# Patient Record
Sex: Male | Born: 1970 | Race: White | Hispanic: No | Marital: Married | Smoking: Never smoker
Health system: Southern US, Community
[De-identification: ages and names within clinical notes are randomized; demographics above are authoritative.]

## PROBLEM LIST (undated history)

## (undated) DIAGNOSIS — R002 Palpitations: Secondary | ICD-10-CM

## (undated) DIAGNOSIS — K8020 Calculus of gallbladder without cholecystitis without obstruction: Secondary | ICD-10-CM

## (undated) DIAGNOSIS — R0789 Other chest pain: Secondary | ICD-10-CM

## (undated) DIAGNOSIS — Z8679 Personal history of other diseases of the circulatory system: Secondary | ICD-10-CM

## (undated) HISTORY — PX: CARDIAC CATHETERIZATION: SHX172

## (undated) HISTORY — PX: CHOLECYSTECTOMY: SHX55

---

## 2007-04-16 ENCOUNTER — Ambulatory Visit: Payer: Self-pay | Admitting: Emergency Medicine

## 2008-08-01 ENCOUNTER — Observation Stay: Payer: Self-pay | Admitting: Cardiovascular Disease

## 2008-08-05 ENCOUNTER — Emergency Department: Payer: Self-pay | Admitting: Emergency Medicine

## 2008-08-06 ENCOUNTER — Ambulatory Visit: Payer: Self-pay | Admitting: General Surgery

## 2008-08-09 ENCOUNTER — Ambulatory Visit: Payer: Self-pay | Admitting: General Surgery

## 2008-08-12 ENCOUNTER — Ambulatory Visit: Payer: Self-pay | Admitting: General Surgery

## 2008-08-19 ENCOUNTER — Ambulatory Visit: Payer: Self-pay | Admitting: General Surgery

## 2011-03-12 IMAGING — CR DG CHEST 1V PORT
1 series · 1 of 1 positions shown · non-contrast
Comparison: none

REASON FOR EXAM: cp
COMMENTS:

PROCEDURE:     DXR - DXR PORTABLE CHEST SINGLE VIEW  - August 01, 2008  [DATE]
RESULT:     The lungs are clear. The cardiac silhouette and visualized bony
skeleton are unremarkable.

[view not recorded]
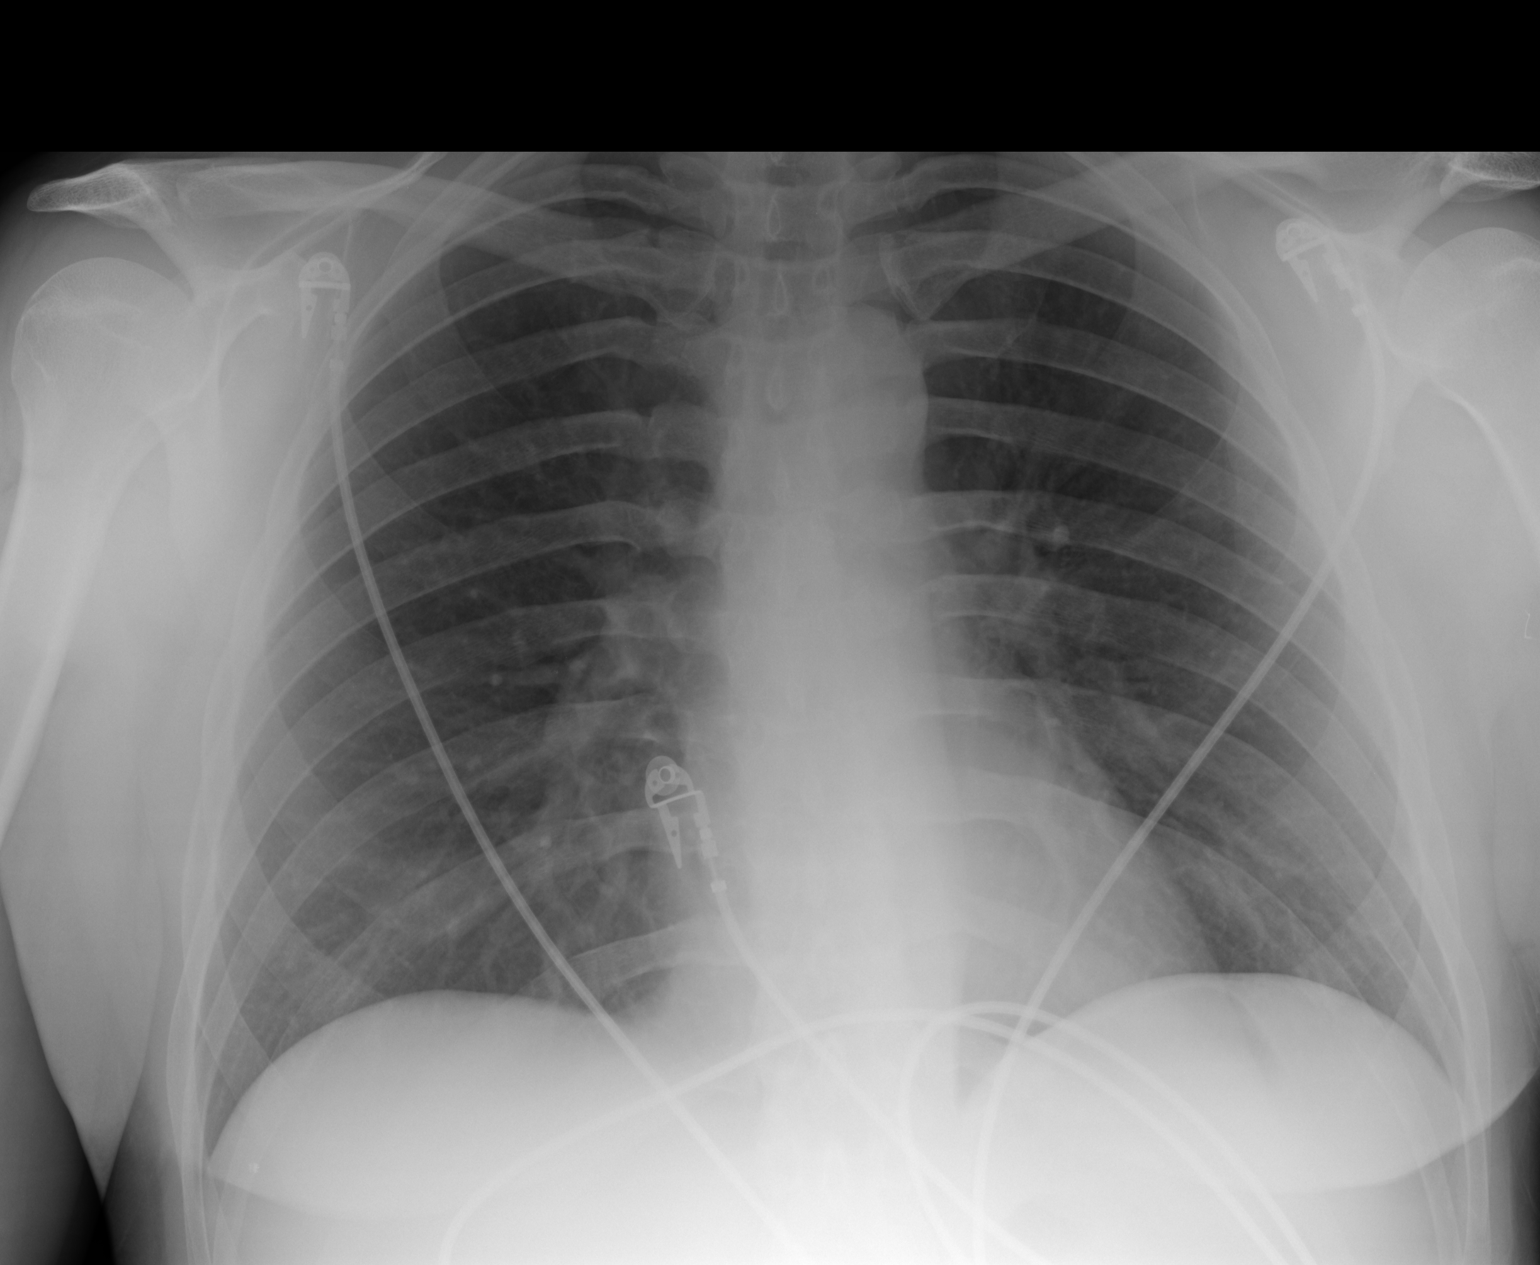

[1 of 1 positions shown; findings below may reference images not displayed]

IMPRESSION: 1. Chest radiograph without evidence of acute cardiopulmonary disease.

## 2015-09-11 ENCOUNTER — Encounter: Payer: Self-pay | Admitting: Gynecology

## 2015-09-11 ENCOUNTER — Ambulatory Visit
Admission: EM | Admit: 2015-09-11 | Discharge: 2015-09-11 | Disposition: A | Discharge status: Home / Self Care | Payer: BLUE CROSS/BLUE SHIELD | Attending: Family Medicine | Admitting: Family Medicine

## 2015-09-11 DIAGNOSIS — J101 Influenza due to other identified influenza virus with other respiratory manifestations: Secondary | ICD-10-CM | POA: Diagnosis not present

## 2015-09-11 HISTORY — DX: Other chest pain: R07.89

## 2015-09-11 HISTORY — DX: Calculus of gallbladder without cholecystitis without obstruction: K80.20

## 2015-09-11 HISTORY — DX: Palpitations: R00.2

## 2015-09-11 LAB — RAPID INFLUENZA A&B ANTIGENS (ARMC ONLY): INFLUENZA A (ARMC): NEGATIVE

## 2015-09-11 LAB — RAPID INFLUENZA A&B ANTIGENS: Influenza B (ARMC): POSITIVE — AB

## 2015-09-11 LAB — RAPID STREP SCREEN (MED CTR MEBANE ONLY): Streptococcus, Group A Screen (Direct): NEGATIVE

## 2015-09-11 MED ORDER — OSELTAMIVIR PHOSPHATE 75 MG PO CAPS: 75.0000 mg | ORAL_CAPSULE | Freq: Two times a day (BID) | ORAL | Status: DC

## 2015-09-11 MED ORDER — BENZONATATE 100 MG PO CAPS: 100.0000 mg | ORAL_CAPSULE | Freq: Three times a day (TID) | ORAL | Status: DC

## 2015-09-11 MED ORDER — FEXOFENADINE-PSEUDOEPHED ER 180-240 MG PO TB24: 1.0000 | ORAL_TABLET | Freq: Every day | ORAL | Status: DC

## 2015-09-11 MED ORDER — MELOXICAM 15 MG PO TABS: 15.0000 mg | ORAL_TABLET | Freq: Every day | ORAL | Status: DC

## 2015-09-11 NOTE — ED Provider Notes (Signed)
CSN: [id number]     Arrival date & time 09/11/15  1153 History   First MD Initiated Contact with Patient 09/11/15 1327     Nurses notes were reviewed. Chief Complaint  Patient presents with  . Generalized Body Aches  . Sore Throat   Patient reports having a sore throat and flulike symptoms started essentially eat Friday. 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 After he got back from his trip was straight to bed late down morning but worse so he came in to be seen. He has not had nor does he usually get flu shots. No nose wound has been sick lately. He's had cholecystectomy cardiac catheterization but never smoked a taking any medication. He has a brother who has hyperlipidemia and mother had lung cancer.  He denies low-grade fever not coughing all the time coughing occasionally and feels achy all over and has myalgia.  (Consider location/radiation/quality/duration/timing/severity/associated sxs/prior Treatment) Patient is a 45 y.o. male presenting with pharyngitis. The history is provided by the patient. No language interpreter was used.  Sore Throat This is a new problem. The current episode started 2 days ago. The problem occurs constantly. The problem has been gradually worsening. Pertinent negatives include no chest pain, no abdominal pain, no headaches and no shortness of breath. Nothing aggravates the symptoms. Nothing relieves the symptoms. He has tried nothing for the symptoms. The treatment provided no relief.    Past Medical History  Diagnosis Date  . Palpitations   . Chest pain, atypical   . Gall bladder stones    Past Surgical History  Procedure Laterality Date  . Cholecystectomy    . Cardiac catheterization     No family history on file. Social History  Substance Use Topics  . Smoking status: Never Smoker   . Smokeless tobacco: None  . Alcohol Use: No    Review of Systems   HENT: Positive for congestion, rhinorrhea, sinus pressure and sore throat.   Respiratory: Positive for cough. Negative for shortness of breath.   Cardiovascular: Negative for chest pain.  Gastrointestinal: Negative for abdominal pain.  Musculoskeletal: Positive for myalgias.  Neurological: Negative for headaches.    Allergies  Review of patient's allergies indicates no known allergies.  Home Medications   Prior to Admission medications   Medication Sig Start Date End Date Taking? Authorizing Provider  benzonatate (TESSALON) 100 MG capsule Take 1 capsule (100 mg total) by mouth every 8 (eight) hours. 09/11/15   Hassan RowanEugene Alijah Hyde, MD  fexofenadine-pseudoephedrine (ALLEGRA-D ALLERGY & CONGESTION) 180-240 MG 24 hr tablet Take 1 tablet by mouth daily. 09/11/15   Hassan RowanEugene Audric Venn, MD  meloxicam (MOBIC) 15 MG tablet Take 1 tablet (15 mg total) by mouth daily. 09/11/15   Hassan RowanEugene Akram Kissick, MD  multivitamin-iron-minerals-folic acid [facility](THERAPEUTIC-M) TABS tablet Take 1 tablet by mouth daily.    Historical Provider, MD  oseltamivir (TAMIFLU) 75 MG capsule Take 1 capsule (75 mg total) by mouth 2 (two) times daily. 09/11/15   Hassan RowanEugene Hermilo Dutter, MD   Meds Ordered and Administered this Visit  Medications - No data to display  BP 118/68 mmHg  Pulse 82  Temp(Src) 99.5 F (37.5 C) (Oral)  Resp 16  Ht 5\' 10"  (1.778 m)  Wt 180 lb (81.647 kg)  BMI 25.83 kg/m2  SpO2 100% No data found.   Physical Exam  Constitutional: He appears well-developed and well-nourished.  HENT:  Head:  Normocephalic and atraumatic.  Right Ear: Hearing, tympanic membrane, external ear and ear canal normal.  Left Ear: Hearing, tympanic membrane, external ear and ear canal normal.  Nose: Mucosal edema and rhinorrhea present. Right sinus exhibits no maxillary sinus tenderness and no frontal sinus tenderness. Left sinus exhibits no maxillary sinus tenderness and no frontal sinus tenderness.  Mouth/Throat: Uvula is midline. Posterior oropharyngeal  erythema present.  Eyes: Pupils are equal, round, and reactive to light.  Vitals reviewed.   ED Course  Procedures (including critical care time)  Labs Review Labs Reviewed  RAPID INFLUENZA A&B ANTIGENS ([facility] ONLY) - Abnormal; Notable for the following:    Influenza B ([facility]) POSITIVE (*)    All other components within normal limits  RAPID STREP SCREEN (NOT AT [facility])  CULTURE, GROUP A STREP [facility])    Imaging Review No results found.   Visual Acuity Review  Right Eye Distance:   Left Eye Distance:   Bilateral Distance:    Right Eye Near:   Left Eye Near:    Bilateral Near:     Results for orders placed or performed during the hospital encounter of 09/11/15  Rapid Influenza A&B Antigens ([facility] only)  Result Value Ref Range   Influenza A ([facility]) NEGATIVE NEGATIVE   Influenza B ([facility]) POSITIVE (A) NEGATIVE  Rapid strep screen  Result Value Ref Range   Streptococcus, Group A Screen (Direct) NEGATIVE NEGATIVE     MDM   1. Type B influenza    Patient with positive B influenza will place him on Tessalon Perles 200 mg 1 by mouth every 8 hour when necessary for cough Tamiflu 75 mg twice a day for 5 days Allegra-D daily basis and Mobic 15 mg aches and pains. Follow-up with PCP if not better in 3 days will give a work note for Monday and Tuesday and no popping for the problems weight of dictation.     Note: This dictation was prepared with Dragon dictation along with smaller phrase technology. Any transcriptional errors that result from this process are unintentional.     Hassan Rowan, MD 09/11/15 [phone]

## 2015-09-11 NOTE — ED Notes (Signed)
Per patient x 2 days body aches , sore throat , chills , fatigue/ congestion / fever at home of 100.

## 2015-09-11 NOTE — Discharge Instructions (Signed)
Influenza, Adult °Influenza (flu) is an infection in the mouth, nose, and throat (respiratory tract) caused by a virus. The flu can make you feel very ill. Influenza spreads easily from person to person (contagious).  °HOME CARE  °· Only take medicines as told by your doctor. °· Use a cool mist humidifier to make breathing easier. °· Get plenty of rest until your fever goes away. This usually takes 3 to 4 days. °· Drink enough fluids to keep your pee (urine) clear or pale yellow. °· Cover your mouth and nose when you cough or sneeze. °· Wash your hands well to avoid spreading the flu. °· Stay home from work or school until your fever has been gone for at least 1 full day. °· Get a flu shot every year. °GET HELP RIGHT AWAY IF:  °· You have trouble breathing or feel short of breath. °· Your skin or nails turn blue. °· You have severe neck pain or stiffness. °· You have a severe headache, facial pain, or earache. °· Your fever gets worse or keeps coming back. °· You feel sick to your stomach (nauseous), throw up (vomit), or have watery poop (diarrhea). °· You have chest pain. °· You have a deep cough that gets worse, or you cough up more thick spit (mucus). °MAKE SURE YOU:  °· Understand these instructions. °· Will watch your condition. °· Will get help right away if you are not doing well or get worse. °  °This information is not intended to replace advice given to you by your health care provider. Make sure you discuss any questions you have with your health care provider. °  °Document Released: 02/14/2008 Document Revised: 05/28/2014 Document Reviewed: 08/06/2011 °Elsevier Interactive Patient Education ©2016 [employer] ° °Upper Respiratory Infection, Adult °Most upper respiratory infections (URIs) are caused by a virus. A URI affects the nose, throat, and upper air passages. The most common type of URI is often called "the common cold." °HOME CARE  °· Take medicines only as told by your doctor. °· Gargle warm  saltwater or take cough drops to comfort your throat as told by your doctor. °· Use a warm mist humidifier or inhale steam from a shower to increase air moisture. This may make it easier to breathe. °· Drink enough fluid to keep your pee (urine) clear or pale yellow. °· Eat soups and other clear broths. °· Have a healthy diet. °· Rest as needed. °· Go back to work when your fever is gone or your doctor says it is okay. °¨ You may need to stay home longer to avoid giving your URI to others. °¨ You can also wear a face mask and wash your hands often to prevent spread of the virus. °· Use your inhaler more if you have asthma. °· Do not use any tobacco products, including cigarettes, chewing tobacco, or electronic cigarettes. If you need help quitting, ask your doctor. °GET HELP IF: °· You are getting worse, not better. °· Your symptoms are not helped by medicine. °· You have chills. °· You are getting more short of breath. °· You have brown or red mucus. °· You have yellow or brown discharge from your nose. °· You have pain in your face, especially when you bend forward. °· You have a fever. °· You have puffy (swollen) neck glands. °· You have pain while swallowing. °· You have white areas in the back of your throat. °GET HELP RIGHT AWAY IF:  °· You have very bad   or constant: °¨ Headache. °¨ Ear pain. °¨ Pain in your forehead, behind your eyes, and over your cheekbones (sinus pain). °¨ Chest pain. °· You have long-lasting (chronic) lung disease and any of the following: °¨ Wheezing. °¨ Long-lasting cough. °¨ Coughing up blood. °¨ A change in your usual mucus. °· You have a stiff neck. °· You have changes in your: °¨ Vision. °¨ Hearing. °¨ Thinking. °¨ Mood. °MAKE SURE YOU:  °· Understand these instructions. °· Will watch your condition. °· Will get help right away if you are not doing well or get worse. °  °This information is not intended to replace advice given to you by your health care provider. Make sure you  discuss any questions you have with your health care provider. °  °Document Released: 10/24/2007 Document Revised: 09/21/2014 Document Reviewed: 08/12/2013 °Elsevier Interactive Patient Education ©2016 Elsevier Inc. ° °

## 2015-09-13 LAB — CULTURE, GROUP A STREP (THRC)

## 2018-04-16 ENCOUNTER — Encounter: Payer: Self-pay | Admitting: Emergency Medicine

## 2018-04-16 ENCOUNTER — Observation Stay
Admission: EM | Admit: 2018-04-16 | Discharge: 2018-04-16 | Disposition: A | Discharge status: Home / Self Care | Payer: BLUE CROSS/BLUE SHIELD | Attending: Family Medicine | Admitting: Family Medicine

## 2018-04-16 ENCOUNTER — Emergency Department: Payer: BLUE CROSS/BLUE SHIELD

## 2018-04-16 ENCOUNTER — Other Ambulatory Visit
Admission: RE | Admit: 2018-04-16 | Discharge: 2018-04-16 | Disposition: A | Discharge status: Home / Self Care | Payer: BLUE CROSS/BLUE SHIELD | Source: Ambulatory Visit | Attending: Family Medicine | Admitting: Family Medicine

## 2018-04-16 ENCOUNTER — Encounter
Admission: EM | Disposition: A | Discharge status: Home / Self Care | Payer: Self-pay | Source: Home / Self Care | Attending: Emergency Medicine

## 2018-04-16 DIAGNOSIS — R7989 Other specified abnormal findings of blood chemistry: Secondary | ICD-10-CM | POA: Diagnosis not present

## 2018-04-16 DIAGNOSIS — M546 Pain in thoracic spine: Secondary | ICD-10-CM | POA: Insufficient documentation

## 2018-04-16 DIAGNOSIS — R002 Palpitations: Secondary | ICD-10-CM

## 2018-04-16 DIAGNOSIS — G8929 Other chronic pain: Secondary | ICD-10-CM | POA: Diagnosis not present

## 2018-04-16 DIAGNOSIS — R0609 Other forms of dyspnea: Secondary | ICD-10-CM | POA: Insufficient documentation

## 2018-04-16 DIAGNOSIS — R030 Elevated blood-pressure reading, without diagnosis of hypertension: Secondary | ICD-10-CM | POA: Insufficient documentation

## 2018-04-16 DIAGNOSIS — I214 Non-ST elevation (NSTEMI) myocardial infarction: Secondary | ICD-10-CM

## 2018-04-16 DIAGNOSIS — R0789 Other chest pain: Secondary | ICD-10-CM | POA: Diagnosis not present

## 2018-04-16 DIAGNOSIS — M549 Dorsalgia, unspecified: Secondary | ICD-10-CM | POA: Insufficient documentation

## 2018-04-16 DIAGNOSIS — I471 Supraventricular tachycardia: Principal | ICD-10-CM | POA: Insufficient documentation

## 2018-04-16 DIAGNOSIS — R9431 Abnormal electrocardiogram [ECG] [EKG]: Secondary | ICD-10-CM | POA: Diagnosis not present

## 2018-04-16 DIAGNOSIS — I4510 Unspecified right bundle-branch block: Secondary | ICD-10-CM | POA: Insufficient documentation

## 2018-04-16 DIAGNOSIS — R079 Chest pain, unspecified: Secondary | ICD-10-CM

## 2018-04-16 HISTORY — PX: LEFT HEART CATH: CATH118248

## 2018-04-16 LAB — COMPREHENSIVE METABOLIC PANEL
ALBUMIN: 4.4 g/dL (ref 3.5–5.0)
ALK PHOS: 55 U/L (ref 38–126)
ALT: 24 U/L (ref 0–44)
AST: 27 U/L (ref 15–41)
Anion gap: 10 (ref 5–15)
BUN: 13 mg/dL (ref 6–20)
CALCIUM: 9.2 mg/dL (ref 8.9–10.3)
CO2: 29 mmol/L (ref 22–32)
CREATININE: 1.05 mg/dL (ref 0.61–1.24)
Chloride: 99 mmol/L (ref 98–111)
GFR calc Af Amer: >60 mL/min (ref >60)
GFR calc non Af Amer: >60 mL/min (ref >60)
GLUCOSE: 154 mg/dL — AB (ref 70–99)
Potassium: 3.9 mmol/L (ref 3.5–5.1)
SODIUM: 138 mmol/L (ref 135–145)
Total Bilirubin: 1.4 mg/dL — ABNORMAL HIGH (ref 0.3–1.2)
Total Protein: 7.2 g/dL (ref 6.5–8.1)

## 2018-04-16 LAB — CBC WITH DIFFERENTIAL/PLATELET
Abs Immature Granulocytes: 0.05 10*3/uL (ref 0.00–0.07)
BASOS ABS: 0 10*3/uL (ref 0.0–0.1)
BASOS PCT: 0 %
EOS ABS: 0.1 10*3/uL (ref 0.0–0.5)
EOS PCT: 1 %
HCT: 44 % (ref 39.0–52.0)
HEMOGLOBIN: 14.6 g/dL (ref 13.0–17.0)
IMMATURE GRANULOCYTES: 1 %
LYMPHS ABS: 0.9 10*3/uL (ref 0.7–4.0)
Lymphocytes Relative: 8 %
MCH: 29 pg (ref 26.0–34.0)
MCHC: 33.2 g/dL (ref 30.0–36.0)
MCV: 87.3 fL (ref 80.0–100.0)
Monocytes Absolute: 0.8 10*3/uL (ref 0.1–1.0)
Monocytes Relative: 8 %
NEUTROS PCT: 82 %
NRBC: 0 % (ref 0.0–0.2)
Neutro Abs: 8.6 10*3/uL — ABNORMAL HIGH (ref 1.7–7.7)
Platelets: 217 10*3/uL (ref 150–400)
RBC: 5.04 MIL/uL (ref 4.22–5.81)
RDW: 11.8 % (ref 11.5–15.5)
WBC: 10.4 10*3/uL (ref 4.0–10.5)

## 2018-04-16 LAB — CKMB (ARMC ONLY): CK, MB: 2.8 ng/mL (ref 0.5–5.0)

## 2018-04-16 LAB — PROTIME-INR
INR: 1.02
PROTHROMBIN TIME: 13.3 s (ref 11.4–15.2)

## 2018-04-16 LAB — TROPONIN I
TROPONIN I: 0.05 ng/mL — AB (ref <0.03)
Troponin I: <0.03 ng/mL (ref <0.03)

## 2018-04-16 LAB — FIBRIN DERIVATIVES D-DIMER (ARMC ONLY): FIBRIN DERIVATIVES D-DIMER (ARMC): 228.57 ng{FEU}/mL (ref 0.00–499.00)

## 2018-04-16 LAB — APTT: aPTT: 32 seconds (ref 24–36)

## 2018-04-16 SURGERY — LEFT HEART CATH
Anesthesia: Moderate Sedation

## 2018-04-16 MED ORDER — HEPARIN BOLUS VIA INFUSION
4000.0000 [IU] | Freq: Once | INTRAVENOUS | Status: DC
  Filled 2018-04-16: qty 4000

## 2018-04-16 MED ORDER — HEPARIN SODIUM (PORCINE) 1000 UNIT/ML IJ SOLN
INTRAMUSCULAR | Status: DC | PRN
  Administered 2018-04-16: 4200 [IU] via INTRAVENOUS

## 2018-04-16 MED ORDER — ASPIRIN EC 325 MG PO TBEC: 325.0000 mg | DELAYED_RELEASE_TABLET | Freq: Every day | ORAL | Status: DC

## 2018-04-16 MED ORDER — METOPROLOL SUCCINATE ER 25 MG PO TB24: 25.0000 mg | ORAL_TABLET | Freq: Every day | ORAL | 0 refills | Status: AC

## 2018-04-16 MED ORDER — SODIUM CHLORIDE 0.9% FLUSH: 3.0000 mL | Freq: Two times a day (BID) | INTRAVENOUS | Status: DC

## 2018-04-16 MED ORDER — HYOSCYAMINE SULFATE 0.125 MG SL SUBL: 0.2500 mg | SUBLINGUAL_TABLET | Freq: Once | SUBLINGUAL | Status: DC

## 2018-04-16 MED ORDER — FENTANYL CITRATE (PF) 100 MCG/2ML IJ SOLN
INTRAMUSCULAR | Status: AC
  Filled 2018-04-16: qty 2

## 2018-04-16 MED ORDER — MORPHINE SULFATE (PF) 4 MG/ML IV SOLN: 2.0000 mg | INTRAVENOUS | Status: DC | PRN

## 2018-04-16 MED ORDER — SODIUM CHLORIDE 0.9% FLUSH: 3.0000 mL | INTRAVENOUS | Status: DC | PRN

## 2018-04-16 MED ORDER — ASPIRIN 325 MG PO TABS
325.0000 mg | ORAL_TABLET | Freq: Once | ORAL | Status: AC
  Administered 2018-04-16: 325 mg via ORAL
  Filled 2018-04-16: qty 1

## 2018-04-16 MED ORDER — ALPRAZOLAM 0.5 MG PO TABS: 0.2500 mg | ORAL_TABLET | Freq: Two times a day (BID) | ORAL | Status: DC | PRN

## 2018-04-16 MED ORDER — SODIUM CHLORIDE 0.9 % WEIGHT BASED INFUSION: 80.0000 mL/h | INTRAVENOUS | Status: DC

## 2018-04-16 MED ORDER — NITROGLYCERIN 0.4 MG SL SUBL: 0.4000 mg | SUBLINGUAL_TABLET | SUBLINGUAL | Status: DC | PRN

## 2018-04-16 MED ORDER — SODIUM CHLORIDE 0.9 % IV SOLN: 250.0000 mL | INTRAVENOUS | Status: DC | PRN

## 2018-04-16 MED ORDER — VERAPAMIL HCL 2.5 MG/ML IV SOLN
INTRAVENOUS | Status: AC
  Filled 2018-04-16: qty 2

## 2018-04-16 MED ORDER — SODIUM CHLORIDE 0.9 % WEIGHT BASED INFUSION: 250.0000 mL/h | INTRAVENOUS | Status: AC

## 2018-04-16 MED ORDER — ACETAMINOPHEN 325 MG PO TABS: 650.0000 mg | ORAL_TABLET | ORAL | Status: DC | PRN

## 2018-04-16 MED ORDER — HEPARIN SODIUM (PORCINE) 1000 UNIT/ML IJ SOLN
INTRAMUSCULAR | Status: AC
  Filled 2018-04-16: qty 1

## 2018-04-16 MED ORDER — HEPARIN (PORCINE) 25000 UT/250ML-% IV SOLN
1000.0000 [IU]/h | INTRAVENOUS | Status: DC
  Filled 2018-04-16: qty 250

## 2018-04-16 MED ORDER — SODIUM CHLORIDE 0.9 % WEIGHT BASED INFUSION: 1.0000 mL/kg/h | INTRAVENOUS | Status: DC

## 2018-04-16 MED ORDER — ENOXAPARIN SODIUM 40 MG/0.4ML ~~LOC~~ SOLN: 40.0000 mg | SUBCUTANEOUS | Status: DC

## 2018-04-16 MED ORDER — METOPROLOL TARTRATE 5 MG/5ML IV SOLN
INTRAVENOUS | Status: DC | PRN
  Administered 2018-04-16: 5 mg via INTRAVENOUS

## 2018-04-16 MED ORDER — ONDANSETRON HCL 4 MG/2ML IJ SOLN: 4.0000 mg | Freq: Four times a day (QID) | INTRAMUSCULAR | Status: DC | PRN

## 2018-04-16 MED ORDER — METOPROLOL SUCCINATE ER 50 MG PO TB24: 25.0000 mg | ORAL_TABLET | Freq: Every day | ORAL | Status: DC

## 2018-04-16 MED ORDER — FENTANYL CITRATE (PF) 100 MCG/2ML IJ SOLN
INTRAMUSCULAR | Status: DC | PRN
  Administered 2018-04-16: 50 ug via INTRAVENOUS

## 2018-04-16 MED ORDER — IOPAMIDOL (ISOVUE-300) INJECTION 61%
INTRAVENOUS | Status: DC | PRN
  Administered 2018-04-16: 90 mL via INTRA_ARTERIAL

## 2018-04-16 MED ORDER — DICYCLOMINE HCL 10 MG/5ML PO SOLN: 10.0000 mg | Freq: Once | ORAL | Status: DC

## 2018-04-16 MED ORDER — HEPARIN (PORCINE) IN NACL 1000-0.9 UT/500ML-% IV SOLN
INTRAVENOUS | Status: AC
  Filled 2018-04-16: qty 1000

## 2018-04-16 MED ORDER — LIDOCAINE VISCOUS HCL 2 % MT SOLN: 15.0000 mL | Freq: Once | OROMUCOSAL | Status: DC

## 2018-04-16 MED ORDER — METOPROLOL TARTRATE 5 MG/5ML IV SOLN
INTRAVENOUS | Status: AC
  Filled 2018-04-16: qty 5

## 2018-04-16 MED ORDER — VERAPAMIL HCL 2.5 MG/ML IV SOLN
INTRAVENOUS | Status: DC | PRN
  Administered 2018-04-16: 2.5 mg via INTRA_ARTERIAL

## 2018-04-16 MED ORDER — MIDAZOLAM HCL 2 MG/2ML IJ SOLN
INTRAMUSCULAR | Status: AC
  Filled 2018-04-16: qty 2

## 2018-04-16 MED ORDER — ALUM & MAG HYDROXIDE-SIMETH 200-200-20 MG/5ML PO SUSP: 30.0000 mL | Freq: Once | ORAL | Status: DC

## 2018-04-16 MED ORDER — MIDAZOLAM HCL 2 MG/2ML IJ SOLN
INTRAMUSCULAR | Status: DC | PRN
  Administered 2018-04-16: 1 mg via INTRAVENOUS

## 2018-04-16 MED ORDER — ASPIRIN 81 MG PO CHEW: 81.0000 mg | CHEWABLE_TABLET | ORAL | Status: DC

## 2018-04-16 SURGICAL SUPPLY — 10 items
CATH INFINITI 5FR ANG PIGTAIL (CATHETERS) ×3 IMPLANT
CATH INFINITI 5FR TG (CATHETERS) ×3 IMPLANT
DEVICE RAD TR BAND REGULAR (VASCULAR PRODUCTS) ×3 IMPLANT
GLIDESHEATH SLEND SS 6F .021 (SHEATH) ×3 IMPLANT
KIT MANI 3VAL PERCEP (MISCELLANEOUS) ×3 IMPLANT
NEEDLE PERC 18GX7CM (NEEDLE) IMPLANT
PACK CARDIAC CATH (CUSTOM PROCEDURE TRAY) ×3 IMPLANT
SHEATH AVANTI 6FR X 11CM (SHEATH) IMPLANT
WIRE GUIDERIGHT .035X150 (WIRE) IMPLANT
WIRE ROSEN-J .035X260CM (WIRE) ×3 IMPLANT

## 2018-04-16 NOTE — H&P (Signed)
[employer] - [place] [facility]   PATIENT NAME: Steven Rowe    MR#:  [id number]  DATE OF BIRTH:  [birth year]  DATE OF ADMISSION:  04/16/2018  PRIMARY CARE PHYSICIAN: No primary care provider on file.   REQUESTING/REFERRING PHYSICIAN:   CHIEF COMPLAINT:   Chief Complaint  Patient presents with  . Abnormal Lab    HISTORY OF PRESENT ILLNESS: Steven Rowe  is a 47 y.o. male with a known history per below, presented to the emergency room with acute on chronic chest pain, code STEMI called in the emergency room, patient evaluated by Dr. Doran ClayParaschos-patient taken to heart catheterization for further evaluation/care, hospitalist asked to admit, patient in no apparent distress, resting comfortably in bed, patient now be admitted for acute on chronic chest pain.  PAST MEDICAL HISTORY:   Past Medical History:  Diagnosis Date  . Chest pain, atypical   . Gall bladder stones   . Palpitations     PAST SURGICAL HISTORY:  Past Surgical History:  Procedure Laterality Date  . CARDIAC CATHETERIZATION    . CHOLECYSTECTOMY      SOCIAL HISTORY:  Social History   Tobacco Use  . Smoking status: Never Smoker  . Smokeless tobacco: Never Used  Substance Use Topics  . Alcohol use: No    FAMILY HISTORY: No family history on file.  DRUG ALLERGIES: No Known Allergies  REVIEW OF SYSTEMS:   CONSTITUTIONAL: No fever, fatigue or weakness.  EYES: No blurred or double vision.  EARS, NOSE, AND THROAT: No tinnitus or ear pain.  RESPIRATORY: No cough, shortness of breath, wheezing or hemoptysis.  CARDIOVASCULAR: No chest pain, orthopnea, edema.  GASTROINTESTINAL: No nausea, vomiting, diarrhea or abdominal pain.  GENITOURINARY: No dysuria, hematuria.  ENDOCRINE: No polyuria, nocturia,  HEMATOLOGY: No anemia, easy bruising or bleeding SKIN: No rash or lesion. MUSCULOSKELETAL: No joint pain or arthritis.   NEUROLOGIC: No tingling, numbness, weakness.  PSYCHIATRY: No anxiety or  depression.   MEDICATIONS AT HOME:  Prior to Admission medications   Medication Sig Start Date End Date Taking? Authorizing Provider  pseudoephedrine (SUDAFED) 30 MG tablet Take 30-60 mg by mouth every 4 (four) hours as needed for congestion.   Yes [provider]  vitamin C (ASCORBIC ACID) 500 MG tablet Take 500 mg by mouth daily as needed (cold symptoms).   Yes [provider]  metoprolol succinate (TOPROL-XL) 25 MG 24 hr tablet Take 1 tablet (25 mg total) by mouth daily. Take with or immediately following a meal. 04/16/18   Jordanny Waddington D, MD      PHYSICAL EXAMINATION:   VITAL SIGNS: Blood pressure 117/60, pulse 75, temperature 98.6 F (37 C), temperature source Oral, resp. rate 15, height 5\' 11"  (1.803 m), weight 81.6 kg, SpO2 98 %.  GENERAL:  47 y.o.-year-old patient lying in the bed with no acute distress.  EYES: Pupils equal, round, reactive to light and accommodation. No scleral icterus. Extraocular muscles intact.  HEENT: Head atraumatic, normocephalic. Oropharynx and nasopharynx clear.  NECK:  Supple, no jugular venous distention. No thyroid enlargement, no tenderness.  LUNGS: Normal breath sounds bilaterally, no wheezing, rales,rhonchi or crepitation. No use of accessory muscles of respiration.  CARDIOVASCULAR: S1, S2 normal. No murmurs, rubs, or gallops.  ABDOMEN: Soft, nontender, nondistended. Bowel sounds present. No organomegaly or mass.  EXTREMITIES: No pedal edema, cyanosis, or clubbing.  NEUROLOGIC: Cranial nerves II through XII are intact. Muscle strength 5/5 in all extremities. Sensation intact. Gait not checked.  PSYCHIATRIC: The patient  is alert and oriented x 3.  SKIN: No obvious rash, lesion, or ulcer.   LABORATORY PANEL:   CBC Recent Labs  Lab 04/16/18 1415  WBC 10.4  HGB 14.6  HCT 44.0  PLT 217  MCV 87.3  MCH 29.0  MCHC 33.2  RDW 11.8  LYMPHSABS 0.9  MONOABS 0.8  EOSABS 0.1  BASOSABS 0.0    ------------------------------------------------------------------------------------------------------------------  Chemistries  Recent Labs  Lab 04/16/18 1415  NA 138  K 3.9  CL 99  CO2 29  GLUCOSE 154*  BUN 13  CREATININE 1.05  CALCIUM 9.2  AST 27  ALT 24  ALKPHOS 55  BILITOT 1.4*   ------------------------------------------------------------------------------------------------------------------ estimated creatinine clearance is 92.6 mL/min (by C-G formula based on SCr of 1.05 mg/dL). ------------------------------------------------------------------------------------------------------------------ No results for input(s): TSH, T4TOTAL, T3FREE, THYROIDAB in the last 72 hours.  Invalid input(s): FREET3   Coagulation profile Recent Labs  Lab 04/16/18 1412  INR 1.02   ------------------------------------------------------------------------------------------------------------------- No results for input(s): DDIMER in the last 72 hours. -------------------------------------------------------------------------------------------------------------------  Cardiac Enzymes Recent Labs  Lab 04/16/18 1243 04/16/18 1415  CKMB 2.8  --   TROPONINI 0.05* <0.03   ------------------------------------------------------------------------------------------------------------------ Invalid input(s): POCBNP  ---------------------------------------------------------------------------------------------------------------  Urinalysis No results found for: COLORURINE, APPEARANCEUR, LABSPEC, PHURINE, GLUCOSEU, HGBUR, BILIRUBINUR, KETONESUR, PROTEINUR, UROBILINOGEN, NITRITE, LEUKOCYTESUR   RADIOLOGY: Dg Chest Port 1 View  Result Date: 04/16/2018 CLINICAL DATA:  Abnormal EKG and elevated troponin today. Negative for chest pain. EXAM: PORTABLE CHEST 1 VIEW COMPARISON:  Single-view of the chest 08/01/2008. FINDINGS: The lungs are clear. Heart size is normal. No pneumothorax or pleural  fluid. Defibrillator pads are in place. No acute or focal bony abnormality. IMPRESSION: Negative chest. Electronically Signed   By: Drusilla Kanner M.D.   On: 04/16/2018 14:35    EKG: Orders placed or performed during the hospital encounter of 04/16/18  . ED EKG  . ED EKG  . EKG 12-Lead  . EKG 12-Lead  . EKG 12-Lead  . EKG 12-Lead  . EKG 12-Lead (at 6am)    IMPRESSION AND PLAN: *Acute on chronic chest pain *History of heart palpitations *History of gallstones  Refer to the observation unit on our chest pain protocol, continue heparin drip, nitroglycerin, beta-blocker therapy,cardiology consulted for expert opinion, currently undergoing heart catheterization by cardiology, adult pain protocol, and continue close medical monitoring  All the records are reviewed and case discussed with ED provider. Management plans discussed with the patient, family and they are in agreement.  CODE STATUS:full    Code Status Orders  (From admission, onward)         Start     Ordered   04/16/18 1638  Full code  Continuous     04/16/18 1637        Code Status History    Date Active Date Inactive Code Status Order ID Comments User Context   04/16/2018 1547 04/16/2018 1637 Full Code [id number]  Marcina Millard, MD Inpatient       TOTAL TIME TAKING CARE OF THIS PATIENT: 45 minutes.    Evelena Asa Brynnleigh Mcelwee M.D on 04/16/2018   Between 7am to 6pm - Pager - [phone]  After 6pm go to [email or web address] - Social research officer, government  [facility] [place] [facility]  Office  [phone]  CC: Primary care physician; No primary care provider on file.   Note: This dictation was prepared with Dragon dictation along with smaller phrase technology. Any transcriptional errors that result from this process are unintentional.

## 2018-04-16 NOTE — ED Notes (Signed)
defib pads on pt 

## 2018-04-16 NOTE — ED Triage Notes (Signed)
Pt in from MD office with abnormal EKG and elevated troponin. Pt in NAD in triage, denies CP but reports is anxious.

## 2018-04-16 NOTE — Consult Note (Signed)
ANTICOAGULATION CONSULT NOTE - Initial Consult  Pharmacy Consult for heparin drip management Indication: chest pain/ACS  Patient Measurements: Height: 5\' 11"  (180.3 cm) IBW/kg (Calculated) : 75.3  Most recent weight: 81kg  Vital Signs: Temp: 98.6 F (37 C) (11/27 1408) Temp Source: Oral (11/27 1408) Pulse Rate: 88 (11/27 1408)  Labs: Recent Labs    04/16/18 1243  CKMB 2.8  TROPONINI 0.05*    CrCl cannot be calculated (No successful lab value found.).   Medical History: Past Medical History:  Diagnosis Date  . Chest pain, atypical   . Gall bladder stones   . Palpitations     Assessment: 47 y.o. male hx of palpitations, here with chest pain, SOB. First troponin 0.05. All baseline labs ordered but not yet reported. He is on no anticoagulants PTA according to med rec.  Goal of Therapy:  Heparin level 0.3-0.7 units/ml Monitor platelets by anticoagulation protocol: Yes   Plan:  Give 4000 units bolus x 1 Start heparin infusion at 1000 units/hr Check anti-Xa level in 6 hours and daily while on heparin Continue to monitor H&H and platelets  Lowella Bandyodney D Ozell Ferrera, PharmD 04/16/2018,2:32 PM

## 2018-04-16 NOTE — Progress Notes (Signed)
   04/16/18 1459  Clinical Encounter Type  Visited With Patient  Visit Type Initial;Spiritual support;Code  Referral From Nurse  Recommendations Follow-up, if needed.  Spiritual Encounters  Spiritual Needs Emotional;Prayer   Chaplain responded to Code Stemi and provided energetic prayer during the patient's assessment. Chaplain remained until the Code Stemi was canceled.

## 2018-04-16 NOTE — ED Notes (Signed)
This RN transporting pt to cath lab. Defib pads on.

## 2018-04-16 NOTE — Discharge Summary (Signed)
[facility] - [place] at [facility]   PATIENT NAME: Steven Rowe    MR#:  [id number]  DATE OF BIRTH:  26-May-1970  DATE OF ADMISSION:  04/16/2018 ADMITTING PHYSICIAN: No admitting provider for patient encounter.  DATE OF DISCHARGE: No discharge date for patient encounter.  PRIMARY CARE PHYSICIAN: No primary care provider on file.    ADMISSION DIAGNOSIS:  abnormal labs  DISCHARGE DIAGNOSIS:  Active Problems:   Chronic chest pain   SECONDARY DIAGNOSIS:   Past Medical History:  Diagnosis Date  . Chest pain, atypical   . Gall bladder stones   . Palpitations     HOSPITAL COURSE:   *Acute on chronic chest pain Patient was referred to telemetry bed on our chest pain protocol, underwent urgent heart catheterization by cardiology which revealed normal coronaries, per cardiology-patient stable for discharge to home on beta-blocker therapy, patient to follow-up with primary care provider for reevaluation in 5 days status post discharge, no complications while in house  DISCHARGE CONDITIONS:   stable  CONSULTS OBTAINED:  Treatment Team:  Dalia Heading, MD  DRUG ALLERGIES:  No Known Allergies  DISCHARGE MEDICATIONS:   Allergies as of 04/16/2018   No Known Allergies     Medication List    TAKE these medications   metoprolol succinate 25 MG 24 hr tablet Commonly known as:  TOPROL-XL Take 1 tablet (25 mg total) by mouth daily. Take with or immediately following a meal.   pseudoephedrine 30 MG tablet Commonly known as:  SUDAFED Take 30-60 mg by mouth every 4 (four) hours as needed for congestion.   vitamin C 500 MG tablet Commonly known as:  ASCORBIC ACID Take 500 mg by mouth daily as needed (cold symptoms).        DISCHARGE INSTRUCTIONS:      If you experience worsening of your admission symptoms, develop shortness of breath, life threatening emergency, suicidal or homicidal thoughts you must seek medical attention immediately by  calling 911 or calling your MD immediately  if symptoms less severe.  You Must read complete instructions/literature along with all the possible adverse reactions/side effects for all the Medicines you take and that have been prescribed to you. Take any new Medicines after you have completely understood and accept all the possible adverse reactions/side effects.   Please note  You were cared for by a hospitalist during your hospital stay. If you have any questions about your discharge medications or the care you received while you were in the hospital after you are discharged, you can call the unit and asked to speak with the hospitalist on call if the hospitalist that took care of you is not available. Once you are discharged, your primary care physician will handle any further medical issues. Please note that NO REFILLS for any discharge medications will be authorized once you are discharged, as it is imperative that you return to your primary care physician (or establish a relationship with a primary care physician if you do not have one) for your aftercare needs so that they can reassess your need for medications and monitor your lab values.    Today   CHIEF COMPLAINT:   Chief Complaint  Patient presents with  . Abnormal Lab    HISTORY OF PRESENT ILLNESS:  47 y.o. male with a known history per below, presented to the emergency room with acute on chronic chest pain, code STEMI called in the emergency room, patient evaluated by Dr. Doran Clay taken to heart catheterization for  further evaluation/care, hospitalist asked to admit, patient in no apparent distress, resting comfortably in bed, patient now be admitted for acute on chronic chest pain.   VITAL SIGNS:  Blood pressure 97/78, pulse 75, temperature 98.6 F (37 C), temperature source Oral, resp. rate 20, height 5\' 11"  (1.803 m), weight 81.6 kg, SpO2 98 %.  I/O:  No intake or output data in the 24 hours ending 04/16/18  1645  PHYSICAL EXAMINATION:  GENERAL:  47 y.o.-year-old patient lying in the bed with no acute distress.  EYES: Pupils equal, round, reactive to light and accommodation. No scleral icterus. Extraocular muscles intact.  HEENT: Head atraumatic, normocephalic. Oropharynx and nasopharynx clear.  NECK:  Supple, no jugular venous distention. No thyroid enlargement, no tenderness.  LUNGS: Normal breath sounds bilaterally, no wheezing, rales,rhonchi or crepitation. No use of accessory muscles of respiration.  CARDIOVASCULAR: S1, S2 normal. No murmurs, rubs, or gallops.  ABDOMEN: Soft, non-tender, non-distended. Bowel sounds present. No organomegaly or mass.  EXTREMITIES: No pedal edema, cyanosis, or clubbing.  NEUROLOGIC: Cranial nerves II through XII are intact. Muscle strength 5/5 in all extremities. Sensation intact. Gait not checked.  PSYCHIATRIC: The patient is alert and oriented x 3.  SKIN: No obvious rash, lesion, or ulcer.   DATA REVIEW:   CBC Recent Labs  Lab 04/16/18 1415  WBC 10.4  HGB 14.6  HCT 44.0  PLT 217    Chemistries  Recent Labs  Lab 04/16/18 1415  NA 138  K 3.9  CL 99  CO2 29  GLUCOSE 154*  BUN 13  CREATININE 1.05  CALCIUM 9.2  AST 27  ALT 24  ALKPHOS 55  BILITOT 1.4*    Cardiac Enzymes Recent Labs  Lab 04/16/18 1415  TROPONINI <0.03    Microbiology Results  Results for orders placed or performed during the hospital encounter of 09/11/15  Rapid Influenza A&B Antigens ([facility] only)     Status: Abnormal   Collection Time: 09/11/15  1:25 PM  Result Value Ref Range Status   Influenza A ([facility]) NEGATIVE NEGATIVE Final   Influenza B ([facility]) POSITIVE (A) NEGATIVE Final  Rapid strep screen     Status: None   Collection Time: 09/11/15  1:25 PM  Result Value Ref Range Status   Streptococcus, Group A Screen (Direct) NEGATIVE NEGATIVE Final    Comment: (NOTE) A Rapid Antigen test may result negative if the antigen level in the sample is below the detection  level of this test. The FDA has not cleared this test as a stand-alone test therefore the rapid antigen negative result has reflexed to a Group A Strep culture.   Culture, group A strep     Status: None   Collection Time: 09/11/15  1:25 PM  Result Value Ref Range Status   Specimen Description THROAT  Final   Special Requests NONE Reflexed from [id number]  Final   Culture NO BETA HEMOLYTIC STREPTOCOCCI ISOLATED  Final   Report Status 09/13/2015 FINAL  Final    RADIOLOGY:  Dg Chest Port 1 View  Result Date: 04/16/2018 CLINICAL DATA:  Abnormal EKG and elevated troponin today. Negative for chest pain. EXAM: PORTABLE CHEST 1 VIEW COMPARISON:  Single-view of the chest 08/01/2008. FINDINGS: The lungs are clear. Heart size is normal. No pneumothorax or pleural fluid. Defibrillator pads are in place. No acute or focal bony abnormality. IMPRESSION: Negative chest. Electronically Signed   By: Drusilla Kannerhomas  Dalessio M.D.   On: 04/16/2018 14:35    EKG:   Orders placed or  performed during the hospital encounter of 04/16/18  . ED EKG  . ED EKG  . EKG 12-Lead  . EKG 12-Lead  . EKG 12-Lead  . EKG 12-Lead  . EKG 12-Lead (at 6am)      Management plans discussed with the patient, family and they are in agreement.  CODE STATUS:     Code Status Orders  (From admission, onward)         Start     Ordered   04/16/18 1638  Full code  Continuous     04/16/18 1637        Code Status History    Date Active Date Inactive Code Status Order ID Comments User Context   04/16/2018 1547 04/16/2018 1637 Full Code [id number]  Marcina Millard, MD Inpatient      TOTAL TIME TAKING CARE OF THIS PATIENT: 45 minutes.    Evelena Asa Carlyne Keehan M.D on 04/16/2018 at 4:45 PM  Between 7am to 6pm - Pager - [phone]  After 6pm go to [email or web address] - Social research officer, government  [facility]  Office  [phone]  CC: Primary care physician; No primary care provider on file.   Note: This dictation  was prepared with Dragon dictation along with smaller phrase technology. Any transcriptional errors that result from this process are unintentional.

## 2018-04-16 NOTE — Consult Note (Signed)
[facility]  CARDIOLOGY CONSULT NOTE  Patient ID: Steven Rowe MRN: [id number] DOB/AGE: 1970-05-24 47 y.o.  Admit date: 04/16/2018 Referring Physician Silverio LayYao Primary [facility]  Reason for Consultation unstable angina  HPI: 47 year old gentleman referred for possible ST elevation myocardial infarction.  Patient has a 2 to 3-week history of intermittent episodes of mid upper back pain.  Last evening, while walking on a treadmill, developed dream fatigue.  He saw his primary care provider today, where ECG revealed the depression in leads III and aVF.  Current was 0.05.  [facility] and code STEMI was called.  Review of systems complete and found to be negative unless listed above     Past Medical History:  Diagnosis Date  . Chest pain, atypical   . Gall bladder stones   . Palpitations     Past Surgical History:  Procedure Laterality Date  . CARDIAC CATHETERIZATION    . CHOLECYSTECTOMY       (Not in a hospital admission) Social History   Socioeconomic History  . Marital status: Married    Spouse name: Not on file  . Number of children: Not on file  . Years of education: Not on file  . Highest education level: Not on file  Occupational History  . Not on file  Social Needs  . Financial resource strain: Not on file  . Food insecurity:    Worry: Not on file    Inability: Not on file  . Transportation needs:    Medical: Not on file    Non-medical: Not on file  Tobacco Use  . Smoking status: Never Smoker  . Smokeless tobacco: Never Used  Substance and Sexual Activity  . Alcohol use: No  . Drug use: Not on file  . Sexual activity: Not on file  Lifestyle  . Physical activity:    Days per week: Not on file    Minutes per session: Not on file  . Stress: Not on file  Relationships  . Social connections:    Talks on phone: Not on file    Gets together: Not on file    Attends religious service: Not on file    Active member  of club or organization: Not on file    Attends meetings of clubs or organizations: Not on file    Relationship status: Not on file  . Intimate partner violence:    Fear of current or ex partner: Not on file    Emotionally abused: Not on file    Physically abused: Not on file    Forced sexual activity: Not on file  Other Topics Concern  . Not on file  Social History Narrative  . Not on file    No family history on file.    Review of systems complete and found to be negative unless listed above      PHYSICAL EXAM  General: Well developed, well nourished, in no acute distress HEENT:  Normocephalic and atramatic Neck:  No JVD.  Lungs: Clear bilaterally to auscultation and percussion. Heart: HRRR . Normal S1 and S2 without gallops or murmurs.  Abdomen: Bowel sounds are positive, abdomen soft and non-tender  Msk:  Back normal, normal gait. Normal strength and tone for age. Extremities: No clubbing, cyanosis or edema.   Neuro: Alert and oriented X 3. Psych:  Good affect, responds appropriately  Labs:   Lab Results  Component Value Date   WBC 10.4 04/16/2018   HGB 14.6 04/16/2018  HCT 44.0 04/16/2018   MCV 87.3 04/16/2018   PLT 217 04/16/2018   No results for input(s): NA, K, CL, CO2, BUN, CREATININE, CALCIUM, PROT, BILITOT, ALKPHOS, ALT, AST, GLUCOSE in the last 168 hours.  Invalid input(s): LABALBU Lab Results  Component Value Date   CKMB 2.8 04/16/2018   TROPONINI 0.05 (HH) 04/16/2018   No results found for: CHOL No results found for: HDL No results found for: LDLCALC No results found for: TRIG No results found for: CHOLHDL No results found for: LDLDIRECT    Radiology: Dg Chest Port 1 View  Result Date: 04/16/2018 CLINICAL DATA:  Abnormal EKG and elevated troponin today. Negative for chest pain. EXAM: PORTABLE CHEST 1 VIEW COMPARISON:  Single-view of the chest 08/01/2008. FINDINGS: The lungs are clear. Heart size is normal. No pneumothorax or pleural fluid.  Defibrillator pads are in place. No acute or focal bony abnormality. IMPRESSION: Negative chest. Electronically Signed   By: Drusilla Kanner M.D.   On: 04/16/2018 14:35    EKG: Normal sinus rhythm, right bundle branch block, 1 mm ST depression in leads III and aVF  ASSESSMENT AND PLAN:   1.  Atypical chest pain, nondiagnostic ECG with ST depression in leads III and aVF, with minimal troponin elevation, not consistent with T elevation myocardial infarction  Recommendations  1.  Cancel code STEMI 2.  We will proceed with diagnostic cardiac catheterization.  The risks, benefits and alternatives of cardiac catheterization and possible PCI were explained to the patient and informed consent was obtained.  Signed: Marcina Millard MD,PhD, [facility] 04/16/2018, 2:39 PM

## 2018-04-16 NOTE — Discharge Instructions (Signed)
Moderate Conscious Sedation, Adult, Care After These instructions provide you with information about caring for yourself after your procedure. Your health care provider may also give you more specific instructions. Your treatment has been planned according to current medical practices, but problems sometimes occur. Call your health care provider if you have any problems or questions after your procedure. What can I expect after the procedure? After your procedure, it is common:  To feel sleepy for several hours.  To feel clumsy and have poor balance for several hours.  To have poor judgment for several hours.  To vomit if you eat too soon.  Follow these instructions at home: For at least 24 hours after the procedure:   Do not: ? Participate in activities where you could fall or become injured. ? Drive. ? Use heavy machinery. ? Drink alcohol. ? Take sleeping pills or medicines that cause drowsiness. ? Make important decisions or sign legal documents. ? Take care of children on your own.  Rest. Eating and drinking  Follow the diet recommended by your health care provider.  If you vomit: ? Drink water, juice, or soup when you can drink without vomiting. ? Make sure you have little or no nausea before eating solid foods. General instructions  Have a responsible adult stay with you until you are awake and alert.  Take over-the-counter and prescription medicines only as told by your health care provider.  If you smoke, do not smoke without supervision.  Keep all follow-up visits as told by your health care provider. This is important. Contact a health care provider if:  You keep feeling nauseous or you keep vomiting.  You feel light-headed.  You develop a rash.  You have a fever. Get help right away if:  You have trouble breathing. This information is not intended to replace advice given to you by your health care provider. Make sure you discuss any questions you have  with your health care provider. Document Released: 02/25/2013 Document Revised: 10/10/2015 Document Reviewed: 08/27/2015 Elsevier Interactive Patient Education  2018 [employer] Radial Site Care Refer to this sheet in the next few weeks. These instructions provide you with information about caring for yourself after your procedure. Your health care provider may also give you more specific instructions. Your treatment has been planned according to current medical practices, but problems sometimes occur. Call your health care provider if you have any problems or questions after your procedure. What can I expect after the procedure? After your procedure, it is typical to have the following:  Bruising at the radial site that usually fades within 1-2 weeks.  Blood collecting in the tissue (hematoma) that may be painful to the touch. It should usually decrease in size and tenderness within 1-2 weeks.  Follow these instructions at home:  Take medicines only as directed by your health care provider.  You may shower 24-48 hours after the procedure or as directed by your health care provider. Remove the bandage (dressing) and gently wash the site with plain soap and water. Pat the area dry with a clean towel. Do not rub the site, because this may cause bleeding.  Do not take baths, swim, or use a hot tub until your health care provider approves.  Check your insertion site every day for redness, swelling, or drainage.  Do not apply powder or lotion to the site.  Do not flex or bend the affected arm for 24 hours or as directed by your health care provider.  Do not   push or pull heavy objects with the affected arm for 24 hours or as directed by your health care provider.  Do not lift over 10 lb (4.5 kg) for 5 days after your procedure or as directed by your health care provider.  Ask your health care provider when it is okay to: ? Return to work or school. ? Resume usual physical activities or  sports. ? Resume sexual activity.  Do not drive home if you are discharged the same day as the procedure. Have someone else drive you.  You may drive 24 hours after the procedure unless otherwise instructed by your health care provider.  Do not operate machinery or power tools for 24 hours after the procedure.  If your procedure was done as an outpatient procedure, which means that you went home the same day as your procedure, a responsible adult should be with you for the first 24 hours after you arrive home.  Keep all follow-up visits as directed by your health care provider. This is important. Contact a health care provider if:  You have a fever.  You have chills.  You have increased bleeding from the radial site. Hold pressure on the site. Get help right away if:  You have unusual pain at the radial site.  You have redness, warmth, or swelling at the radial site.  You have drainage (other than a small amount of blood on the dressing) from the radial site.  The radial site is bleeding, and the bleeding does not stop after 30 minutes of holding steady pressure on the site.  Your arm or hand becomes pale, cool, tingly, or numb. This information is not intended to replace advice given to you by your health care provider. Make sure you discuss any questions you have with your health care provider. Document Released: 06/09/2010 Document Revised: 10/13/2015 Document Reviewed: 11/23/2013 Elsevier Interactive Patient Education  2018 [employer]  

## 2018-04-16 NOTE — ED Provider Notes (Addendum)
[facility] EMERGENCY DEPARTMENT Provider Note   CSN: [id number] Arrival date & time: 04/16/18  1402     History   Chief Complaint Chief Complaint  Patient presents with  . Abnormal Lab    HPI Steven Rowe is a 47 y.o. male hx of palpitations, here with chest pain, shortness of breath.  Patient has intermittent chest pressure and palpitations for the last several months.  Patient states that he had progressively gotten worse.  Today he was at the gym and was running on the treadmill and had sudden onset of worsening shortness of breath  The history is provided by the patient.    Past Medical History:  Diagnosis Date  . Chest pain, atypical   . Gall bladder stones   . Palpitations     There are no active problems to display for this patient.   Past Surgical History:  Procedure Laterality Date  . CARDIAC CATHETERIZATION    . CHOLECYSTECTOMY          Home Medications    Prior to Admission medications   Medication Sig Start Date End Date Taking? Authorizing Provider  pseudoephedrine (SUDAFED) 30 MG tablet Take 30-60 mg by mouth every 4 (four) hours as needed for congestion.   Yes [provider]  vitamin C (ASCORBIC ACID) 500 MG tablet Take 500 mg by mouth daily as needed (cold symptoms).   Yes [provider]  benzonatate (TESSALON) 100 MG capsule Take 1 capsule (100 mg total) by mouth every 8 (eight) hours. 09/11/15   Hassan Rowan, MD  fexofenadine-pseudoephedrine (ALLEGRA-D ALLERGY & CONGESTION) 180-240 MG 24 hr tablet Take 1 tablet by mouth daily. 09/11/15   Hassan Rowan, MD  meloxicam (MOBIC) 15 MG tablet Take 1 tablet (15 mg total) by mouth daily. 09/11/15   Hassan Rowan, MD  oseltamivir (TAMIFLU) 75 MG capsule Take 1 capsule (75 mg total) by mouth 2 (two) times daily. 09/11/15   Hassan Rowan, MD    Family History No family history on file.  Social History Social History   Tobacco Use  . Smoking status: Never Smoker  .  Smokeless tobacco: Never Used  Substance Use Topics  . Alcohol use: No  . Drug use: Not on file     Allergies   Patient has no known allergies.   Review of Systems Review of Systems  Cardiovascular: Positive for chest pain.  All other systems reviewed and are negative.    Physical Exam Updated Vital Signs BP 136/87   Pulse 89   Temp 98.6 F (37 C) (Oral)   Resp (!) 26   Ht 5\' 11"  (1.803 m)   SpO2 98%   BMI 25.10 kg/m   Physical Exam  Constitutional: He is oriented to person, place, and time.  Slightly uncomfortable   HENT:  Head: Normocephalic.  Mouth/Throat: Oropharynx is clear and moist.  Eyes: Pupils are equal, round, and reactive to light. Conjunctivae and EOM are normal.  Neck: Normal range of motion. Neck supple.  Cardiovascular: Normal rate, regular rhythm and normal heart sounds.  Pulmonary/Chest: Effort normal and breath sounds normal. No stridor. No respiratory distress.  Abdominal: Soft. Bowel sounds are normal. He exhibits no distension. There is no tenderness.  Musculoskeletal: Normal range of motion.  Neurological: He is alert and oriented to person, place, and time. No cranial nerve deficit. Coordination normal.  Skin: Skin is warm.  Psychiatric: He has a normal mood and affect.  Nursing note and vitals reviewed.  ED Treatments / Results  Labs (all labs ordered are listed, but only abnormal results are displayed) Labs Reviewed  CBC WITH DIFFERENTIAL/PLATELET - Abnormal; Notable for the following components:      Result Value   Neutro Abs 8.6 (*)    All other components within normal limits  COMPREHENSIVE METABOLIC PANEL - Abnormal; Notable for the following components:   Glucose, Bld 154 (*)    Total Bilirubin 1.4 (*)    All other components within normal limits  FIBRIN DERIVATIVES D-DIMER ([facility] ONLY)  TROPONIN I  PROTIME-INR  APTT    EKG None   ED ECG REPORT I, Richardean Canalavid H Yao, the attending physician, personally viewed and  interpreted this ECG.   Date: 04/16/2018  EKG Time: 14:08  Rate: 84  Rhythm: NSR  Axis: normal  Intervals:none  ST&T Change: ST depression inferior leads, ST elevated V2, V3    Radiology Dg Chest Port 1 View  Result Date: 04/16/2018 CLINICAL DATA:  Abnormal EKG and elevated troponin today. Negative for chest pain. EXAM: PORTABLE CHEST 1 VIEW COMPARISON:  Single-view of the chest 08/01/2008. FINDINGS: The lungs are clear. Heart size is normal. No pneumothorax or pleural fluid. Defibrillator pads are in place. No acute or focal bony abnormality. IMPRESSION: Negative chest. Electronically Signed   By: Drusilla Kannerhomas  Dalessio M.D.   On: 04/16/2018 14:35    Procedures Procedures (including critical care time)  CRITICAL CARE Performed by: Richardean Canalavid H Yao   Total critical care time: 30 minutes  Critical care time was exclusive of separately billable procedures and treating other patients.  Critical care was necessary to treat or prevent imminent or life-threatening deterioration.  Critical care was time spent personally by me on the following activities: development of treatment plan with patient and/or surrogate as well as nursing, discussions with consultants, evaluation of patient's response to treatment, examination of patient, obtaining history from patient or surrogate, ordering and performing treatments and interventions, ordering and review of laboratory studies, ordering and review of radiographic studies, pulse oximetry and re-evaluation of patient's condition.   Medications Ordered in ED Medications  heparin bolus via infusion 4,000 Units ( Intravenous Automatically Held 04/16/18 1500)  heparin ADULT infusion 100 units/mL (25000 units/22050mL sodium chloride 0.45%) (has no administration in time range)  0.9% sodium chloride infusion (has no administration in time range)    Followed by  0.9% sodium chloride infusion (has no administration in time range)  sodium chloride flush (NS) 0.9  % injection 3 mL (has no administration in time range)  sodium chloride flush (NS) 0.9 % injection 3 mL (has no administration in time range)  0.9 %  sodium chloride infusion (has no administration in time range)  aspirin chewable tablet 81 mg (has no administration in time range)  aspirin tablet 325 mg (325 mg Oral Given 04/16/18 1420)     Initial Impression / Assessment and Plan / ED Course  I have reviewed the triage vital signs and the nursing notes.  Pertinent labs & imaging results that were available during my care of the patient were reviewed by me and considered in my medical decision making (see chart for details).    Steven Rowe is a 47 y.o. male here with chest pain, palpitations. Patient has chest pain that is worse when he exerted himself yesterday at the treadmill. His trop was positive outpatient. His initial EKG showed mild ST depressions leads II and aVF and there is some elevation V2, V3. I consulted Dr. Darrold JunkerParaschos and activated code  STEMI.   2:30 pm Dr. Darrold Junker saw patient, canceled STEMI and request hospitalist admission. He will cath patient but not emergently.   2:59 PM Patient went up to preop cath lab. D-dimer neg. Hospitalist to admit for unstable angina vs NSTEMI.    Final Clinical Impressions(s) / ED Diagnoses   Final diagnoses:  NSTEMI (non-ST elevated myocardial infarction) [facility])    ED Discharge Orders    None       Charlynne Pander, MD 04/16/18 1459    Charlynne Pander, MD 04/16/18 [phone]

## 2018-04-21 ENCOUNTER — Encounter: Payer: Self-pay | Admitting: Cardiology

## 2020-09-15 ENCOUNTER — Other Ambulatory Visit: Admission: RE | Admit: 2020-09-15 | Payer: BLUE CROSS/BLUE SHIELD | Source: Ambulatory Visit

## 2020-11-24 IMAGING — DX DG CHEST 1V PORT
2 series · 2 of 2 positions shown · non-contrast
Comparison: Single-view of the chest 08/01/2008.

CLINICAL DATA: Abnormal EKG and elevated troponin today. Negative
for chest pain.

EXAM:
PORTABLE CHEST 1 VIEW

[chest ap (1 of 2)]
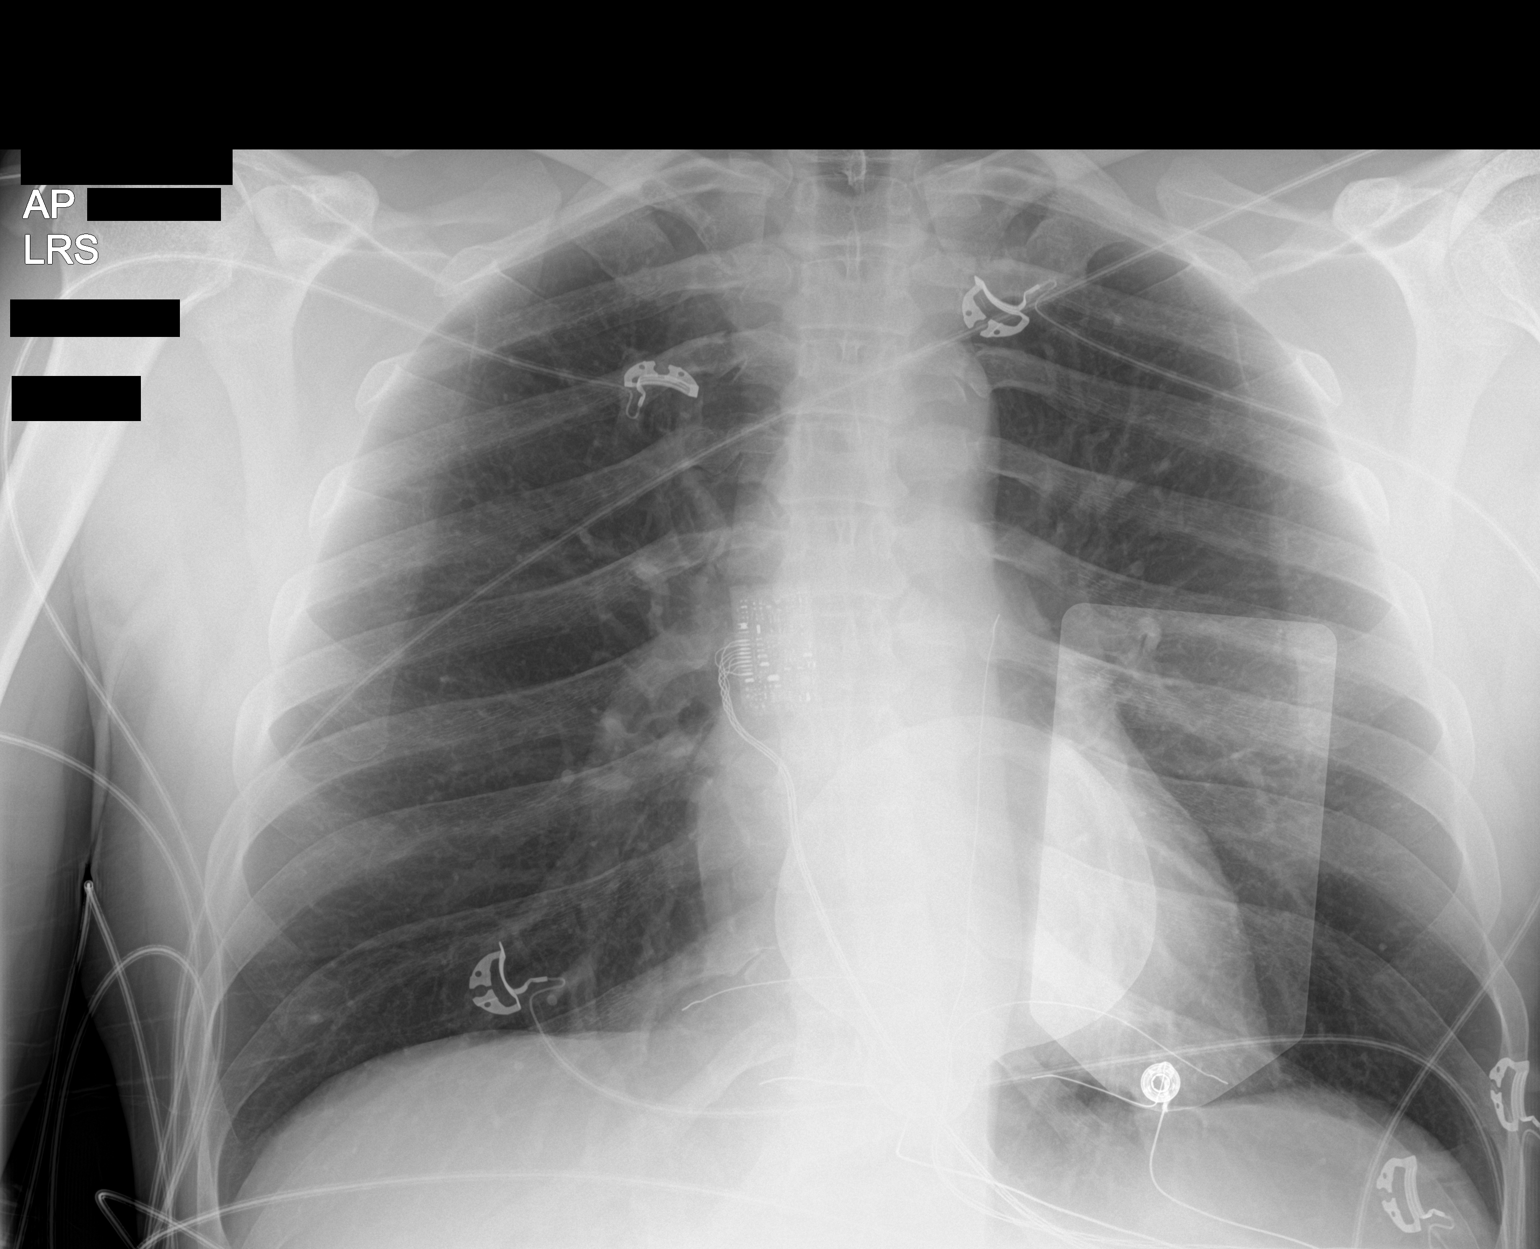

[chest ap (2 of 2)]
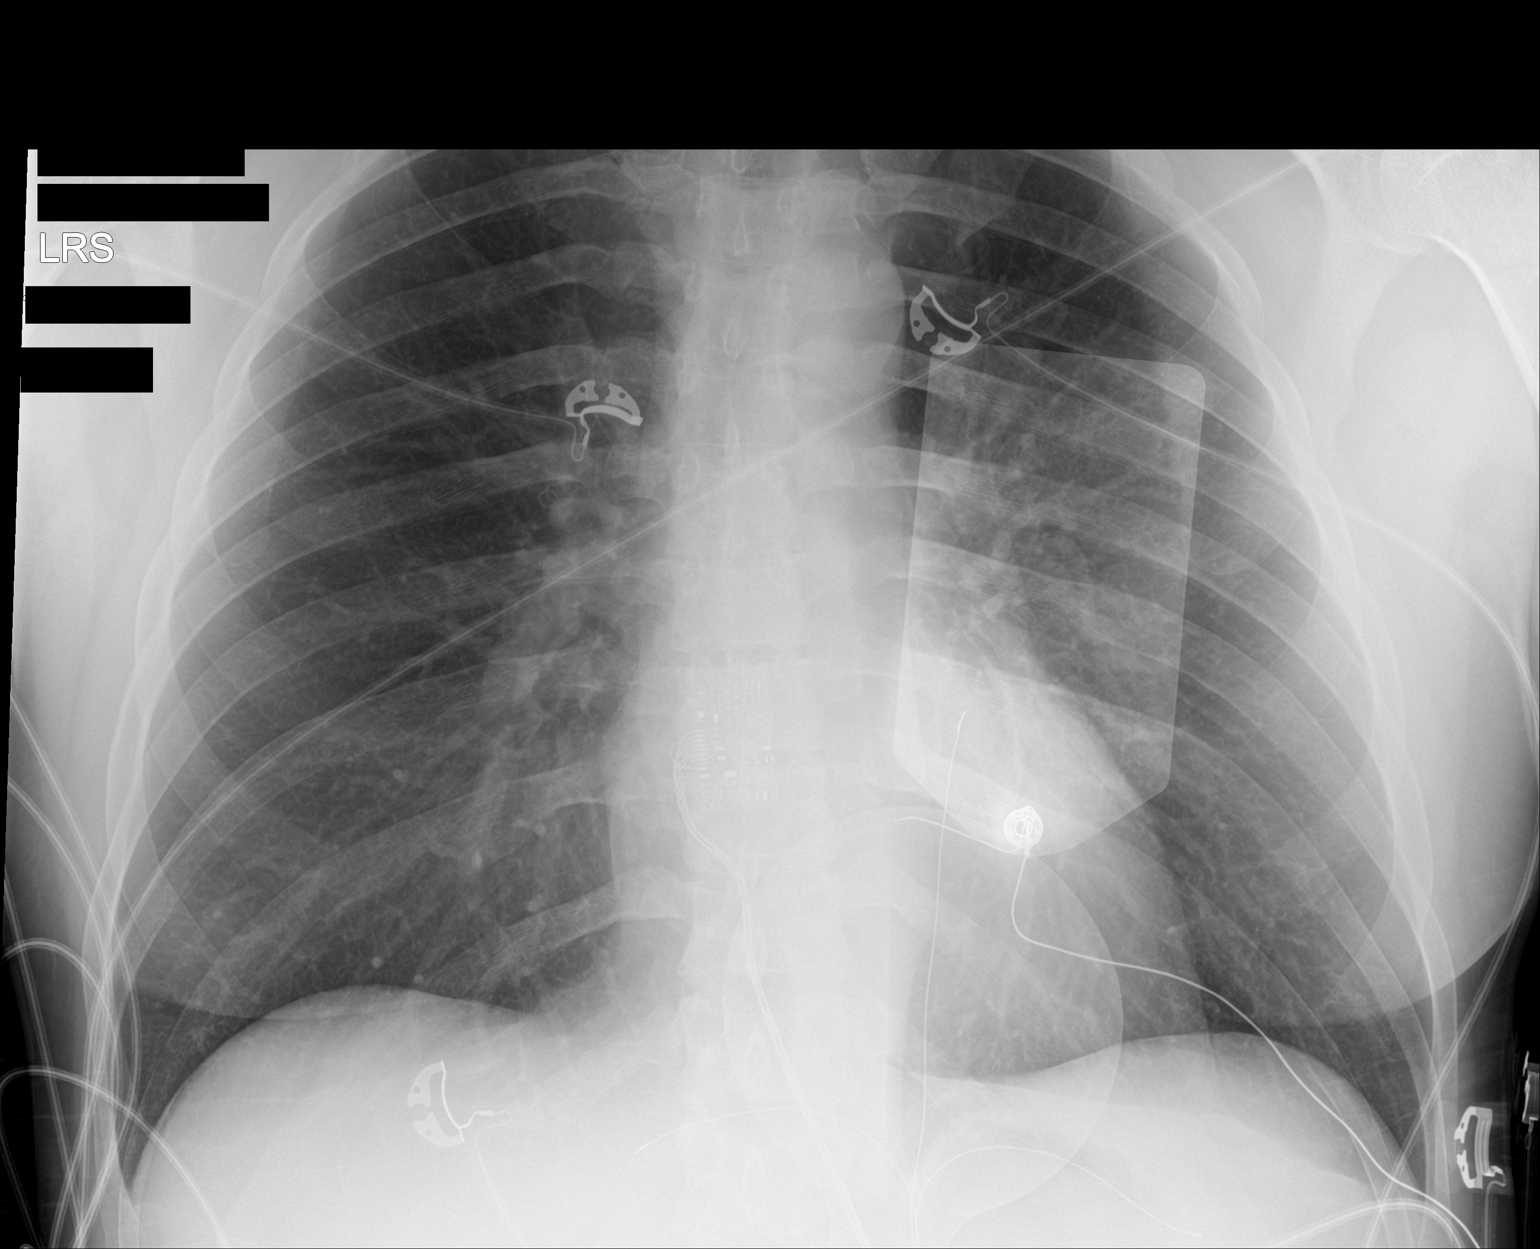

[2 of 2 positions shown; findings below may reference images not displayed]

FINDINGS: The lungs are clear. Heart size is normal. No pneumothorax or
pleural fluid. Defibrillator pads are in place. No acute or focal
bony abnormality.
IMPRESSION: Negative chest.

## 2020-12-09 ENCOUNTER — Encounter: Payer: Self-pay | Admitting: *Deleted

## 2020-12-12 ENCOUNTER — Ambulatory Visit: Payer: No Typology Code available for payment source | Admitting: Anesthesiology

## 2020-12-12 ENCOUNTER — Encounter: Payer: Self-pay | Admitting: *Deleted

## 2020-12-12 ENCOUNTER — Encounter
Admission: RE | Disposition: A | Discharge status: Home / Self Care | Payer: Self-pay | Source: Home / Self Care | Attending: Gastroenterology

## 2020-12-12 ENCOUNTER — Ambulatory Visit
Admission: RE | Admit: 2020-12-12 | Discharge: 2020-12-12 | Disposition: A | Discharge status: Home / Self Care | Payer: No Typology Code available for payment source | Attending: Gastroenterology | Admitting: Gastroenterology

## 2020-12-12 ENCOUNTER — Other Ambulatory Visit: Payer: Self-pay

## 2020-12-12 DIAGNOSIS — Z79899 Other long term (current) drug therapy: Secondary | ICD-10-CM | POA: Insufficient documentation

## 2020-12-12 DIAGNOSIS — K5730 Diverticulosis of large intestine without perforation or abscess without bleeding: Secondary | ICD-10-CM | POA: Insufficient documentation

## 2020-12-12 DIAGNOSIS — Z1211 Encounter for screening for malignant neoplasm of colon: Secondary | ICD-10-CM | POA: Diagnosis not present

## 2020-12-12 DIAGNOSIS — Z9049 Acquired absence of other specified parts of digestive tract: Secondary | ICD-10-CM | POA: Insufficient documentation

## 2020-12-12 DIAGNOSIS — K635 Polyp of colon: Secondary | ICD-10-CM | POA: Insufficient documentation

## 2020-12-12 HISTORY — PX: COLONOSCOPY WITH PROPOFOL: SHX5780

## 2020-12-12 HISTORY — DX: Personal history of other diseases of the circulatory system: Z86.79

## 2020-12-12 SURGERY — COLONOSCOPY WITH PROPOFOL
Anesthesia: General

## 2020-12-12 MED ORDER — MIDAZOLAM HCL 2 MG/2ML IJ SOLN
INTRAMUSCULAR | Status: DC | PRN
  Administered 2020-12-12: 2 mg via INTRAVENOUS

## 2020-12-12 MED ORDER — PROPOFOL 500 MG/50ML IV EMUL
INTRAVENOUS | Status: DC | PRN
  Administered 2020-12-12: 50 ug/kg/min via INTRAVENOUS

## 2020-12-12 MED ORDER — FENTANYL CITRATE (PF) 100 MCG/2ML IJ SOLN
INTRAMUSCULAR | Status: DC | PRN
  Administered 2020-12-12 (×2): 50 ug via INTRAVENOUS

## 2020-12-12 MED ORDER — SODIUM CHLORIDE 0.9 % IV SOLN: INTRAVENOUS | Status: DC

## 2020-12-12 MED ORDER — MIDAZOLAM HCL 2 MG/2ML IJ SOLN
INTRAMUSCULAR | Status: AC
  Filled 2020-12-12: qty 2

## 2020-12-12 MED ORDER — PROPOFOL 10 MG/ML IV BOLUS
INTRAVENOUS | Status: DC | PRN
  Administered 2020-12-12: 50 mg via INTRAVENOUS

## 2020-12-12 MED ORDER — FENTANYL CITRATE (PF) 100 MCG/2ML IJ SOLN
INTRAMUSCULAR | Status: AC
  Filled 2020-12-12: qty 2

## 2020-12-12 NOTE — Anesthesia Preprocedure Evaluation (Signed)
Anesthesia Evaluation  Patient identified by MRN, date of birth, ID band Patient awake    Reviewed: Allergy & Precautions, NPO status , Patient's Chart, lab work & pertinent test results  Airway Mallampati: II  TM Distance: >3 FB Neck ROM: full    Dental  (+) Chipped,    Pulmonary neg pulmonary ROS,    Pulmonary exam normal        Cardiovascular negative cardio ROS Normal cardiovascular exam     Neuro/Psych negative neurological ROS  negative psych ROS   GI/Hepatic negative GI ROS, Neg liver ROS,   Endo/Other  negative endocrine ROS  Renal/GU negative Renal ROS  negative genitourinary   Musculoskeletal   Abdominal   Peds  Hematology negative hematology ROS (+)   Anesthesia Other Findings Past Medical History: No date: Chest pain, atypical No date: Gall bladder stones No date: History of right bundle branch block (RBBB) No date: Palpitations  Past Surgical History: No date: CARDIAC CATHETERIZATION No date: CHOLECYSTECTOMY 04/16/2018: LEFT HEART CATH; N/A     Comment:  Procedure: Left Heart Cath and Coronary Angiography;                Surgeon: Marcina Millard, MD;  Location: [facility]               INVASIVE CV LAB;  Service: Cardiovascular;  Laterality:               N/A;  BMI    Body Mass Index: 24.39 kg/m      Reproductive/Obstetrics negative OB ROS                             Anesthesia Physical Anesthesia Plan  ASA: 2  Anesthesia Plan: General   Post-op Pain Management:    Induction: Intravenous  PONV Risk Score and Plan: Propofol infusion and TIVA  Airway Management Planned: Natural Airway and Nasal Cannula  Additional Equipment:   Intra-op Plan:   Post-operative Plan:   Informed Consent: I have reviewed the patients History and Physical, chart, labs and discussed the procedure including the risks, benefits and alternatives for the proposed anesthesia with  the patient or authorized representative who has indicated his/her understanding and acceptance.     Dental Advisory Given  Plan Discussed with: Anesthesiologist, CRNA and Surgeon  Anesthesia Plan Comments: (Patient consented for risks of anesthesia including but not limited to:  - adverse reactions to medications - risk of airway placement if required - damage to eyes, teeth, lips or other oral mucosa - nerve damage due to positioning  - sore throat or hoarseness - Damage to heart, brain, nerves, lungs, other parts of body or loss of life  Patient voiced understanding.)        Anesthesia Quick Evaluation

## 2020-12-12 NOTE — H&P (Signed)
Outpatient short stay form Pre-procedure 12/12/2020 2:29 PM Merlyn Lot MD, MPH  Primary Physician: Dr. Burnadette Pop  Reason for visit:  Screening Colonoscopy  History of present illness:   50 y/o gentleman with no significant medical history here for screening colonoscopy. No blood thinners. No family history of GI malignancies. History of cholecystectomy. No new symptoms.    Current Facility-Administered Medications:    0.9 %  sodium chloride infusion, , Intravenous, Continuous, Jomayra Novitsky, Rossie Muskrat, MD, Last Rate: 20 mL/hr at 12/12/20 1316, New Bag at 12/12/20 1316  Medications Prior to Admission  Medication Sig Dispense Refill Last Dose   fluticasone (FLONASE) 50 MCG/ACT nasal spray Place 2 sprays into both nostrils daily.   Past Month   ibuprofen (ADVIL) 800 MG tablet Take 800 mg by mouth every 8 (eight) hours as needed.   Past Month   loratadine (CLARITIN) 10 MG tablet Take 10 mg by mouth daily.   Past Month   Multiple Vitamin (MULTIVITAMIN) tablet Take 1 tablet by mouth daily.   12/11/2020   pseudoephedrine (SUDAFED) 30 MG tablet Take 30-60 mg by mouth every 4 (four) hours as needed for congestion.   Past Month   vitamin C (ASCORBIC ACID) 500 MG tablet Take 500 mg by mouth daily as needed (cold symptoms).   Past Week   albuterol (VENTOLIN HFA) 108 (90 Base) MCG/ACT inhaler Inhale 1-2 puffs into the lungs every 4 (four) hours as needed for wheezing or shortness of breath. (Patient not taking: Reported on 12/12/2020)   Not Taking   metoprolol succinate (TOPROL-XL) 25 MG 24 hr tablet Take 1 tablet (25 mg total) by mouth daily. Take with or immediately following a meal. (Patient not taking: Reported on 12/12/2020) 90 tablet 0 Not Taking     No Known Allergies   Past Medical History:  Diagnosis Date   Chest pain, atypical    Gall bladder stones    History of right bundle branch block (RBBB)    Palpitations     Review of systems:  Otherwise negative.    Physical Exam  Gen:  Alert, oriented. Appears stated age.  HEENT: PERRLA. Lungs: No respiratory distress CV: RRR Abd: soft, benign, no masses Ext: No edema    Planned procedures: Proceed with colonoscopy. The patient understands the nature of the planned procedure, indications, risks, alternatives and potential complications including but not limited to bleeding, infection, perforation, damage to internal organs and possible oversedation/side effects from anesthesia. The patient agrees and gives consent to proceed.  Please refer to procedure notes for findings, recommendations and patient disposition/instructions.     Merlyn Lot MD, MPH Gastroenterology 12/12/2020  2:29 PM

## 2020-12-12 NOTE — Op Note (Signed)
[facility] Gastroenterology Patient Name: Steven Rowe Procedure Date: 12/12/2020 2:28 PM MRN: [id number] Account #: [id number] Date of Birth: July 08, 1970 Admit Type: Outpatient Age: 50 Room: [facility] ENDO ROOM 3 Gender: Male Note Status: Finalized Procedure:             Colonoscopy Indications:           Screening for colorectal malignant neoplasm Providers:             Andrey Farmer MD, MD Referring MD:          No Local Md, MD (Referring MD) Medicines:             Monitored Anesthesia Care Complications:         No immediate complications. Estimated blood loss:                         Minimal. Procedure:             Pre-Anesthesia Assessment:                        - Prior to the procedure, a History and Physical was                         performed, and patient medications and allergies were                         reviewed. The patient is competent. The risks and                         benefits of the procedure and the sedation options and                         risks were discussed with the patient. All questions                         were answered and informed consent was obtained.                         Patient identification and proposed procedure were                         verified by the physician, the nurse, the anesthetist                         and the technician in the endoscopy suite. Mental                         Status Examination: alert and oriented. Airway                         Examination: normal oropharyngeal airway and neck                         mobility. Respiratory Examination: clear to                         auscultation. CV Examination: normal. Prophylactic  Antibiotics: The patient does not require prophylactic                         antibiotics. Prior Anticoagulants: The patient has                         taken no previous anticoagulant or antiplatelet                         agents. ASA Grade  Assessment: I - A normal, healthy                         patient. After reviewing the risks and benefits, the                         patient was deemed in satisfactory condition to                         undergo the procedure. The anesthesia plan was to use                         monitored anesthesia care (MAC). Immediately prior to                         administration of medications, the patient was                         re-assessed for adequacy to receive sedatives. The                         heart rate, respiratory rate, oxygen saturations,                         blood pressure, adequacy of pulmonary ventilation, and                         response to care were monitored throughout the                         procedure. The physical status of the patient was                         re-assessed after the procedure.                        After obtaining informed consent, the colonoscope was                         passed under direct vision. Throughout the procedure,                         the patient's blood pressure, pulse, and oxygen                         saturations were monitored continuously. The                         Colonoscope was introduced through the anus and  advanced to the the cecum, identified by appendiceal                         orifice and ileocecal valve. The colonoscopy was                         performed without difficulty. The patient tolerated                         the procedure well. The quality of the bowel                         preparation was good. Findings:      The perianal and digital rectal examinations were normal.      A less than 1 mm polyp was found in the descending colon. The polyp was       sessile. The polyp was removed with a jumbo cold forceps. Resection and       retrieval were complete. Estimated blood loss was minimal.      Scattered small-mouthed diverticula were found in the sigmoid colon and        descending colon.      The exam was otherwise without abnormality on direct and retroflexion       views. Impression:            - One less than 1 mm polyp in the descending colon,                         removed with a jumbo cold forceps. Resected and                         retrieved.                        - Diverticulosis in the sigmoid colon and in the                         descending colon.                        - The examination was otherwise normal on direct and                         retroflexion views. Recommendation:        - Discharge patient to home.                        - Resume previous diet.                        - Continue present medications.                        - Await pathology results.                        - Repeat colonoscopy in 10 years for surveillance.                        - Return to referring physician as previously  scheduled. Procedure Code(s):     --- Professional ---                        [phone], Colonoscopy, flexible; with biopsy, single or                         multiple Diagnosis Code(s):     --- Professional ---                        Z12.11, Encounter for screening for malignant neoplasm                         of colon                        K63.5, Polyp of colon                        K57.30, Diverticulosis of large intestine without                         perforation or abscess without bleeding CPT copyright 2019 [employer]. All rights reserved. The codes documented in this report are preliminary and upon coder review may  be revised to meet current compliance requirements. Andrey Farmer MD, MD 12/12/2020 2:54:55 PM Number of Addenda: 0 Note Initiated On: 12/12/2020 2:28 PM Scope Withdrawal Time: 0 hours 8 minutes 33 seconds  Total Procedure Duration: 0 hours 12 minutes 13 seconds  Estimated Blood Loss:  Estimated blood loss was minimal.      [facility]

## 2020-12-12 NOTE — Transfer of Care (Signed)
Immediate Anesthesia Transfer of Care Note  Patient: Steven Rowe  Procedure(s) Performed: COLONOSCOPY WITH PROPOFOL  Patient Location: PACU  Anesthesia Type:General  Level of Consciousness: sedated  Airway & Oxygen Therapy: Patient Spontanous Breathing and Patient connected to nasal cannula oxygen  Post-op Assessment: Report given to RN and Post -op Vital signs reviewed and stable  Post vital signs: Reviewed and stable  Last Vitals:  Vitals Value Taken Time  BP 116/62 12/12/20 1454  Temp 36.3 C 12/12/20 1453  Pulse 69 12/12/20 1455  Resp 10 12/12/20 1455  SpO2 98 % 12/12/20 1455  Vitals shown include unvalidated device data.  Last Pain:  Vitals:   12/12/20 1453  TempSrc: Temporal  PainSc: 0-No pain         Complications: No notable events documented.

## 2020-12-12 NOTE — Interval H&P Note (Signed)
History and Physical Interval Note:  12/12/2020 2:31 PM  Steven Rowe  has presented today for surgery, with the diagnosis of colon screen.  The various methods of treatment have been discussed with the patient and family. After consideration of risks, benefits and other options for treatment, the patient has consented to  Procedure(s): COLONOSCOPY WITH PROPOFOL (N/A) as a surgical intervention.  The patient's history has been reviewed, patient examined, no change in status, stable for surgery.  I have reviewed the patient's chart and labs.  Questions were answered to the patient's satisfaction.     Regis Bill  Ok to proceed with colonoscopy

## 2020-12-13 ENCOUNTER — Encounter: Payer: Self-pay | Admitting: Gastroenterology

## 2020-12-13 NOTE — Anesthesia Postprocedure Evaluation (Signed)
Anesthesia Post Note  Patient: Steven Rowe  Procedure(s) Performed: COLONOSCOPY WITH PROPOFOL  Patient location during evaluation: Endoscopy Anesthesia Type: General Level of consciousness: awake and alert Pain management: pain level controlled Vital Signs Assessment: post-procedure vital signs reviewed and stable Respiratory status: spontaneous breathing, nonlabored ventilation, respiratory function stable and patient connected to nasal cannula oxygen Cardiovascular status: blood pressure returned to baseline and stable Postop Assessment: no apparent nausea or vomiting Anesthetic complications: no   No notable events documented.   Last Vitals:  Vitals:   12/12/20 1513 12/12/20 1522  BP: 114/81 116/73  Pulse: (!) 59 (!) 57  Resp: (!) 23 18  Temp:    SpO2: 99% 98%    Last Pain:  Vitals:   12/13/20 0739  TempSrc:   PainSc: 0-No pain                 Johny Blamer

## 2020-12-15 LAB — SURGICAL PATHOLOGY

## 2022-03-27 ENCOUNTER — Other Ambulatory Visit: Payer: Self-pay | Admitting: Family Medicine

## 2022-03-27 DIAGNOSIS — Z8249 Family history of ischemic heart disease and other diseases of the circulatory system: Secondary | ICD-10-CM

## 2022-04-03 ENCOUNTER — Ambulatory Visit
Admission: RE | Admit: 2022-04-03 | Discharge: 2022-04-03 | Disposition: A | Discharge status: Home / Self Care | Payer: No Typology Code available for payment source | Source: Ambulatory Visit | Attending: Family Medicine | Admitting: Family Medicine

## 2022-04-03 DIAGNOSIS — Z8249 Family history of ischemic heart disease and other diseases of the circulatory system: Secondary | ICD-10-CM | POA: Insufficient documentation

## 2023-12-31 ENCOUNTER — Ambulatory Visit
Admission: EM | Admit: 2023-12-31 | Discharge: 2023-12-31 | Disposition: A | Discharge status: Home / Self Care | Attending: Physician Assistant | Admitting: Physician Assistant

## 2023-12-31 DIAGNOSIS — Z23 Encounter for immunization: Secondary | ICD-10-CM

## 2023-12-31 DIAGNOSIS — S61211A Laceration without foreign body of left index finger without damage to nail, initial encounter: Secondary | ICD-10-CM

## 2023-12-31 MED ORDER — TETANUS-DIPHTH-ACELL PERTUSSIS 5-2.5-18.5 LF-MCG/0.5 IM SUSY
0.5000 mL | PREFILLED_SYRINGE | Freq: Once | INTRAMUSCULAR | Status: AC
  Administered 2023-12-31 (×2): 0.5 mL via INTRAMUSCULAR

## 2023-12-31 NOTE — ED Triage Notes (Signed)
 Patient states that he was carrying a mini fridge today and cut his left point finger on something metal. Unsure of last tdap.

## 2023-12-31 NOTE — Discharge Instructions (Signed)
-   6 sutures were placed in your finger.  Tetanus was also updated. - Return in a week to have the stitches taken out. - Do not get the area wet for 48 hours -After 48 hours, clean it gently with soap and water.  Make sure to change the bandage every day. - Tylenol  and/or Motrin as needed for pain relief. - Come in sooner than 1 week if you notice increased swelling, redness or pustular drainage.

## 2023-12-31 NOTE — ED Provider Notes (Signed)
 [facility]    CSN: [id number] Arrival date & time: 12/31/23  1121      History   Chief Complaint Chief Complaint  Patient presents with   Laceration    HPI Steven Rowe is a 53 y.o. male presenting for left index finger laceration that occurred today when he cut he finger on a piece of  metal from a mini fridge. Full ROM. No numbness/tingling, weakness. Unknown tetanus status.   HPI  Past Medical History:  Diagnosis Date   Chest pain, atypical    Gall bladder stones    History of right bundle branch block (RBBB)    Palpitations     Patient Active Problem List   Diagnosis Date Noted   Chronic chest pain 04/16/2018    Past Surgical History:  Procedure Laterality Date   CARDIAC CATHETERIZATION     CHOLECYSTECTOMY     COLONOSCOPY WITH PROPOFOL  N/A 12/12/2020   Procedure: COLONOSCOPY WITH PROPOFOL ;  Surgeon: Maryruth Ole DASEN, MD;  Location: [facility] ENDOS[facility];  Service: Endoscopy;  Laterality: N/A;   LEFT HEART CATH N/A 04/16/2018   Procedure: Left Heart Cath and Coronary Angiography;  Surgeon: Ammon Blunt, MD;  Location: [facility] INVASIVE [facility];  Service: Cardiovascular;  Laterality: N/A;       Home Medications    Prior to Admission medications   Medication Sig Start Date End Date Taking? Authorizing Provider  albuterol (VENTOLIN HFA) 108 (90 Base) MCG/ACT inhaler Inhale 1-2 puffs into the lungs every 4 (four) hours as needed for wheezing or shortness of breath. Patient not taking: Reported on 12/12/2020    [provider]  fluticasone (FLONASE) 50 MCG/ACT nasal spray Place 2 sprays into both nostrils daily.    [provider]  ibuprofen (ADVIL) 800 MG tablet Take 800 mg by mouth every 8 (eight) hours as needed.    [provider]  loratadine (CLARITIN) 10 MG tablet Take 10 mg by mouth daily.    [provider]  metoprolol  succinate (TOPROL -XL) 25 MG 24 hr tablet Take 1 tablet (25 mg total) by mouth daily. Take  with or immediately following a meal. Patient not taking: Reported on 12/12/2020 04/16/18   Salary, Nemiah BIRCH, MD  Multiple Vitamin (MULTIVITAMIN) tablet Take 1 tablet by mouth daily.    [provider]  pseudoephedrine (SUDAFED) 30 MG tablet Take 30-60 mg by mouth every 4 (four) hours as needed for congestion.    [provider]  vitamin C (ASCORBIC ACID) 500 MG tablet Take 500 mg by mouth daily as needed (cold symptoms).    [provider]    Family History History reviewed. No pertinent family history.  Social History Social History   Tobacco Use   Smoking status: Never   Smokeless tobacco: Never  Vaping Use   Vaping status: Never Used  Substance Use Topics   Alcohol use: No   Drug use: Never     Allergies   Patient has no known allergies.   Review of Systems Review of Systems  Musculoskeletal:  Negative for arthralgias and joint swelling.  Skin:  Positive for wound. Negative for color change.  Neurological:  Negative for weakness and numbness.     Physical Exam Triage Vital Signs ED Triage Vitals  Encounter Vitals Group     BP 12/31/23 1144 136/71     Girls Systolic BP Percentile --      Girls Diastolic BP Percentile --      Boys Systolic BP Percentile --  Boys Diastolic BP Percentile --      Pulse Rate 12/31/23 1144 67     Resp 12/31/23 1144 17     Temp 12/31/23 1144 98.3 F (36.8 C)     Temp Source 12/31/23 1144 Oral     SpO2 12/31/23 1144 100 %     Weight 12/31/23 1143 175 lb (79.4 kg)     Height --      Head Circumference --      Peak Flow --      Pain Score 12/31/23 1143 6     Pain Loc --      Pain Education --      Exclude from Growth Chart --    No data found.  Updated Vital Signs BP 136/71 (BP Location: Right Arm)   Pulse 67   Temp 98.3 F (36.8 C) (Oral)   Resp 17   Wt 175 lb (79.4 kg)   SpO2 100%   BMI 25.11 kg/m      Physical Exam Vitals and nursing note reviewed.  Constitutional:      General:  He is not in acute distress.    Appearance: Normal appearance. He is well-developed. He is not ill-appearing.  HENT:     Head: Normocephalic and atraumatic.  Eyes:     General: No scleral icterus.    Conjunctiva/sclera: Conjunctivae normal.  Cardiovascular:     Rate and Rhythm: Normal rate.     Pulses: Normal pulses.  Pulmonary:     Effort: Pulmonary effort is normal. No respiratory distress.  Musculoskeletal:     Cervical back: Neck supple.  Skin:    General: Skin is warm and dry.     Capillary Refill: Capillary refill takes less than 2 seconds.     Comments: 2 cm curved laceration of left index finger with exposed fatty tissue. Mild active bleeding which is controlled with direct pressure. Good pulses and full ROM of finger.  Neurological:     General: No focal deficit present.     Mental Status: He is alert. Mental status is at baseline.     Motor: No weakness.     Gait: Gait normal.  Psychiatric:        Mood and Affect: Mood normal.        Behavior: Behavior normal.      UC Treatments / Results  Labs (all labs ordered are listed, but only abnormal results are displayed) Labs Reviewed - No data to display  EKG   Radiology No results found.  Procedures Laceration Repair  Date/Time: 12/31/2023 1:07 PM  Performed by: Arvis Jolan NOVAK, PA-C Authorized by: Arvis Jolan NOVAK, PA-C   Consent:    Consent obtained:  Verbal   Consent given by:  Patient   Risks discussed:  Infection, pain, retained foreign body, poor cosmetic result and poor wound healing Universal protocol:    Patient identity confirmed:  Verbally with patient Anesthesia:    Anesthesia method:  Local infiltration   Local anesthetic:  Lidocaine  1% w/o epi Laceration details:    Location:  Finger   Finger location:  L index finger   Length (cm):  2 Pre-procedure details:    Preparation:  Patient was prepped and draped in usual sterile fashion Exploration:    Hemostasis achieved with:  Direct  pressure   Wound exploration: wound explored through full range of motion     Contaminated: no   Treatment:    Area cleansed with:  Saline   Amount of  cleaning:  Standard   Irrigation solution:  Sterile saline   Visualized foreign bodies/material removed: no     Debridement:  None Skin repair:    Repair method:  Sutures   Suture size:  5-0   Suture technique:  Simple interrupted   Number of sutures:  6 Approximation:    Approximation:  Close Repair type:    Repair type:  Simple Post-procedure details:    Dressing:  Non-adherent dressing   Procedure completion:  Tolerated well, no immediate complications  (including critical care time)  Medications Ordered in UC Medications  Tdap (BOOSTRIX) injection 0.5 mL (0.5 mLs Intramuscular Given 12/31/23 1201)    Initial Impression / Assessment and Plan / UC Course  I have reviewed the triage vital signs and the nursing notes.  Pertinent labs & imaging results that were available during my care of the patient were reviewed by me and considered in my medical decision making (see chart for details).   53 year old male presents for laceration of left index finger after he cut it on a piece of metal from a mini fridge.  Tetanus status unknown.  2 cm curved laceration of the left index finger with active bleeding and exposed fatty tissue.  No bony tenderness and full range of motion.  6 simple sutures placed.  Nonadherent pad and finger splint applied.  Patient advised of wound care guidelines.  Tetanus updated.  Advised returning in 7 days for suture removal or sooner for any signs of infection.   Final Clinical Impressions(s) / UC Diagnoses   Final diagnoses:  Laceration of left index finger without foreign body without damage to nail, initial encounter     Discharge Instructions      - 6 sutures were placed in your finger.  Tetanus was also updated. - Return in a week to have the stitches taken out. - Do not get the area wet for  48 hours -After 48 hours, clean it gently with soap and water.  Make sure to change the bandage every day. - Tylenol  and/or Motrin as needed for pain relief. - Come in sooner than 1 week if you notice increased swelling, redness or pustular drainage.     ED Prescriptions   None    PDMP not reviewed this encounter.   Arvis Jolan NOVAK, PA-C 12/31/23 1311

## 2024-01-07 ENCOUNTER — Ambulatory Visit
Admission: EM | Admit: 2024-01-07 | Discharge: 2024-01-07 | Disposition: A | Discharge status: Home / Self Care | Attending: Emergency Medicine | Admitting: Emergency Medicine

## 2024-01-07 DIAGNOSIS — Z4802 Encounter for removal of sutures: Secondary | ICD-10-CM | POA: Diagnosis not present

## 2024-01-07 NOTE — ED Triage Notes (Signed)
 Pt presents to UC for suture removal from [facility] on 8/12. Had 6 sutures placed in L index finger.

## 2024-03-31 ENCOUNTER — Other Ambulatory Visit: Payer: Self-pay | Admitting: Family Medicine

## 2024-03-31 DIAGNOSIS — Z8249 Family history of ischemic heart disease and other diseases of the circulatory system: Secondary | ICD-10-CM

## 2024-03-31 DIAGNOSIS — Z0000 Encounter for general adult medical examination without abnormal findings: Secondary | ICD-10-CM

## 2024-04-09 ENCOUNTER — Ambulatory Visit
Admission: RE | Admit: 2024-04-09 | Discharge: 2024-04-09 | Disposition: A | Discharge status: Home / Self Care | Payer: Self-pay | Source: Ambulatory Visit | Attending: Family Medicine | Admitting: Family Medicine

## 2024-04-09 DIAGNOSIS — Z0000 Encounter for general adult medical examination without abnormal findings: Secondary | ICD-10-CM | POA: Insufficient documentation

## 2024-04-09 DIAGNOSIS — Z8249 Family history of ischemic heart disease and other diseases of the circulatory system: Secondary | ICD-10-CM | POA: Insufficient documentation
